# Patient Record
Sex: Female | Born: 1940 | Race: White | Hispanic: No | Smoking: Former smoker
Health system: Southern US, Community
[De-identification: ages and names within clinical notes are randomized; demographics above are authoritative.]

## PROBLEM LIST (undated history)

## (undated) ENCOUNTER — Emergency Department (HOSPITAL_COMMUNITY): Discharge status: Against Medical Advice | Payer: Medicare Other

## (undated) DIAGNOSIS — M722 Plantar fascial fibromatosis: Secondary | ICD-10-CM

## (undated) DIAGNOSIS — E039 Hypothyroidism, unspecified: Secondary | ICD-10-CM

## (undated) DIAGNOSIS — I10 Essential (primary) hypertension: Secondary | ICD-10-CM

## (undated) DIAGNOSIS — E669 Obesity, unspecified: Secondary | ICD-10-CM

## (undated) DIAGNOSIS — C55 Malignant neoplasm of uterus, part unspecified: Secondary | ICD-10-CM

## (undated) DIAGNOSIS — R000 Tachycardia, unspecified: Secondary | ICD-10-CM

## (undated) DIAGNOSIS — I313 Pericardial effusion (noninflammatory): Secondary | ICD-10-CM

## (undated) HISTORY — PX: CATARACT EXTRACTION: SUR2

## (undated) HISTORY — DX: Plantar fascial fibromatosis: M72.2

## (undated) HISTORY — DX: Obesity, unspecified: E66.9

## (undated) HISTORY — PX: GANGLION CYST EXCISION: SHX1691

## (undated) HISTORY — PX: UMBILICAL HERNIA REPAIR: SHX196

## (undated) HISTORY — DX: Hypothyroidism, unspecified: E03.9

## (undated) HISTORY — PX: TONSILLECTOMY: SUR1361

## (undated) HISTORY — DX: Malignant neoplasm of uterus, part unspecified: C55

## (undated) HISTORY — PX: ABDOMINAL HYSTERECTOMY: SHX81

---

## 1997-11-04 ENCOUNTER — Other Ambulatory Visit: Admission: RE | Admit: 1997-11-04 | Discharge: 1997-11-04 | Payer: Self-pay | Admitting: Obstetrics & Gynecology

## 1998-04-22 ENCOUNTER — Other Ambulatory Visit: Admission: RE | Admit: 1998-04-22 | Discharge: 1998-04-22 | Payer: Self-pay | Admitting: Obstetrics & Gynecology

## 1998-12-10 ENCOUNTER — Other Ambulatory Visit: Admission: RE | Admit: 1998-12-10 | Discharge: 1998-12-10 | Payer: Self-pay | Admitting: Obstetrics & Gynecology

## 2000-01-19 ENCOUNTER — Other Ambulatory Visit: Admission: RE | Admit: 2000-01-19 | Discharge: 2000-01-19 | Payer: Self-pay | Admitting: Obstetrics & Gynecology

## 2001-02-06 ENCOUNTER — Other Ambulatory Visit: Admission: RE | Admit: 2001-02-06 | Discharge: 2001-02-06 | Payer: Self-pay | Admitting: Obstetrics & Gynecology

## 2002-03-12 ENCOUNTER — Other Ambulatory Visit: Admission: RE | Admit: 2002-03-12 | Discharge: 2002-03-12 | Payer: Self-pay | Admitting: Obstetrics & Gynecology

## 2003-04-08 ENCOUNTER — Other Ambulatory Visit: Admission: RE | Admit: 2003-04-08 | Discharge: 2003-04-08 | Payer: Self-pay | Admitting: Obstetrics & Gynecology

## 2003-12-16 ENCOUNTER — Ambulatory Visit (HOSPITAL_COMMUNITY): Admission: RE | Admit: 2003-12-16 | Discharge: 2003-12-16 | Payer: Self-pay | Admitting: General Surgery

## 2003-12-16 ENCOUNTER — Encounter (INDEPENDENT_AMBULATORY_CARE_PROVIDER_SITE_OTHER): Payer: Self-pay | Admitting: *Deleted

## 2004-05-11 ENCOUNTER — Other Ambulatory Visit: Admission: RE | Admit: 2004-05-11 | Discharge: 2004-05-11 | Payer: Self-pay | Admitting: Obstetrics & Gynecology

## 2004-05-19 ENCOUNTER — Ambulatory Visit: Payer: Self-pay | Admitting: Internal Medicine

## 2004-05-26 ENCOUNTER — Ambulatory Visit: Payer: Self-pay | Admitting: Internal Medicine

## 2005-05-30 ENCOUNTER — Other Ambulatory Visit: Admission: RE | Admit: 2005-05-30 | Discharge: 2005-05-30 | Payer: Self-pay | Admitting: Obstetrics & Gynecology

## 2008-04-03 ENCOUNTER — Ambulatory Visit: Admission: RE | Admit: 2008-04-03 | Discharge: 2008-04-03 | Payer: Self-pay | Admitting: Gynecology

## 2008-04-07 ENCOUNTER — Encounter (INDEPENDENT_AMBULATORY_CARE_PROVIDER_SITE_OTHER): Payer: Self-pay | Admitting: Obstetrics & Gynecology

## 2008-04-07 ENCOUNTER — Inpatient Hospital Stay (HOSPITAL_COMMUNITY): Admission: RE | Admit: 2008-04-07 | Discharge: 2008-04-10 | Payer: Self-pay | Admitting: Obstetrics & Gynecology

## 2008-04-13 ENCOUNTER — Ambulatory Visit: Payer: Self-pay | Admitting: Oncology

## 2008-04-28 ENCOUNTER — Ambulatory Visit: Admission: RE | Admit: 2008-04-28 | Discharge: 2008-04-28 | Payer: Self-pay | Admitting: Gynecology

## 2008-05-12 LAB — CBC WITH DIFFERENTIAL/PLATELET
BASO%: 0.4 % (ref 0.0–2.0)
Basophils Absolute: 0 10*3/uL (ref 0.0–0.1)
EOS%: 4.2 % (ref 0.0–7.0)
Eosinophils Absolute: 0.3 10*3/uL (ref 0.0–0.5)
HCT: 43.1 % (ref 34.8–46.6)
HGB: 14.4 g/dL (ref 11.6–15.9)
LYMPH%: 19.6 % (ref 14.0–49.7)
MCH: 30.2 pg (ref 25.1–34.0)
MCHC: 33.3 g/dL (ref 31.5–36.0)
MCV: 90.6 fL (ref 79.5–101.0)
MONO#: 0.4 10*3/uL (ref 0.1–0.9)
MONO%: 6.5 % (ref 0.0–14.0)
NEUT#: 4.7 10*3/uL (ref 1.5–6.5)
NEUT%: 69.3 % (ref 38.4–76.8)
Platelets: 234 10*3/uL (ref 145–400)
RBC: 4.76 10*6/uL (ref 3.70–5.45)
RDW: 13.1 % (ref 11.2–14.5)
WBC: 6.8 10*3/uL (ref 3.9–10.3)
lymph#: 1.3 10*3/uL (ref 0.9–3.3)

## 2008-05-12 LAB — COMPREHENSIVE METABOLIC PANEL
ALT: 17 U/L (ref 0–35)
AST: 15 U/L (ref 0–37)
Albumin: 4.4 g/dL (ref 3.5–5.2)
Alkaline Phosphatase: 75 U/L (ref 39–117)
BUN: 13 mg/dL (ref 6–23)
CO2: 29 mEq/L (ref 19–32)
Calcium: 10 mg/dL (ref 8.4–10.5)
Chloride: 101 mEq/L (ref 96–112)
Creatinine, Ser: 0.81 mg/dL (ref 0.40–1.20)
Glucose, Bld: 166 mg/dL — ABNORMAL HIGH (ref 70–99)
Potassium: 4 mEq/L (ref 3.5–5.3)
Sodium: 141 mEq/L (ref 135–145)
Total Bilirubin: 0.9 mg/dL (ref 0.3–1.2)
Total Protein: 6.9 g/dL (ref 6.0–8.3)

## 2008-05-15 LAB — WHOLE BLOOD GLUCOSE
Glucose: 239 mg/dL — ABNORMAL HIGH (ref 70–100)
HRS PC: 1 Hours

## 2008-05-21 LAB — CBC WITH DIFFERENTIAL/PLATELET
BASO%: 0.2 % (ref 0.0–2.0)
Basophils Absolute: 0 10*3/uL (ref 0.0–0.1)
EOS%: 3.7 % (ref 0.0–7.0)
Eosinophils Absolute: 0.2 10*3/uL (ref 0.0–0.5)
HCT: 40.2 % (ref 34.8–46.6)
HGB: 13.6 g/dL (ref 11.6–15.9)
LYMPH%: 23.6 % (ref 14.0–49.7)
MCH: 30.6 pg (ref 25.1–34.0)
MCHC: 33.9 g/dL (ref 31.5–36.0)
MCV: 90.4 fL (ref 79.5–101.0)
MONO#: 0.2 10*3/uL (ref 0.1–0.9)
MONO%: 2.9 % (ref 0.0–14.0)
NEUT#: 4 10*3/uL (ref 1.5–6.5)
NEUT%: 69.6 % (ref 38.4–76.8)
Platelets: 210 10*3/uL (ref 145–400)
RBC: 4.45 10*6/uL (ref 3.70–5.45)
RDW: 13 % (ref 11.2–14.5)
WBC: 5.8 10*3/uL (ref 3.9–10.3)
lymph#: 1.4 10*3/uL (ref 0.9–3.3)

## 2008-05-27 LAB — CBC WITH DIFFERENTIAL/PLATELET
BASO%: 0.6 % (ref 0.0–2.0)
Basophils Absolute: 0 10*3/uL (ref 0.0–0.1)
EOS%: 3.4 % (ref 0.0–7.0)
Eosinophils Absolute: 0.1 10*3/uL (ref 0.0–0.5)
HCT: 37.9 % (ref 34.8–46.6)
HGB: 13.1 g/dL (ref 11.6–15.9)
LYMPH%: 39.6 % (ref 14.0–49.7)
MCH: 31.2 pg (ref 25.1–34.0)
MCHC: 34.4 g/dL (ref 31.5–36.0)
MCV: 90.7 fL (ref 79.5–101.0)
MONO#: 0.4 10*3/uL (ref 0.1–0.9)
MONO%: 12.6 % (ref 0.0–14.0)
NEUT#: 1.3 10*3/uL — ABNORMAL LOW (ref 1.5–6.5)
NEUT%: 43.8 % (ref 38.4–76.8)
Platelets: 207 10*3/uL (ref 145–400)
RBC: 4.18 10*6/uL (ref 3.70–5.45)
RDW: 13 % (ref 11.2–14.5)
WBC: 2.9 10*3/uL — ABNORMAL LOW (ref 3.9–10.3)
lymph#: 1.2 10*3/uL (ref 0.9–3.3)

## 2008-05-27 LAB — COMPREHENSIVE METABOLIC PANEL
ALT: 22 U/L (ref 0–35)
AST: 17 U/L (ref 0–37)
Albumin: 4.2 g/dL (ref 3.5–5.2)
Alkaline Phosphatase: 67 U/L (ref 39–117)
BUN: 17 mg/dL (ref 6–23)
CO2: 26 mEq/L (ref 19–32)
Calcium: 9.4 mg/dL (ref 8.4–10.5)
Chloride: 106 mEq/L (ref 96–112)
Creatinine, Ser: 0.81 mg/dL (ref 0.40–1.20)
Glucose, Bld: 153 mg/dL — ABNORMAL HIGH (ref 70–99)
Potassium: 4 mEq/L (ref 3.5–5.3)
Sodium: 143 mEq/L (ref 135–145)
Total Bilirubin: 0.7 mg/dL (ref 0.3–1.2)
Total Protein: 6.6 g/dL (ref 6.0–8.3)

## 2008-06-01 ENCOUNTER — Ambulatory Visit: Payer: Self-pay | Admitting: Oncology

## 2008-06-03 LAB — CBC WITH DIFFERENTIAL/PLATELET
BASO%: 0.4 % (ref 0.0–2.0)
Basophils Absolute: 0 10*3/uL (ref 0.0–0.1)
EOS%: 1.9 % (ref 0.0–7.0)
Eosinophils Absolute: 0.1 10*3/uL (ref 0.0–0.5)
HCT: 40.6 % (ref 34.8–46.6)
HGB: 13.6 g/dL (ref 11.6–15.9)
LYMPH%: 26.2 % (ref 14.0–49.7)
MCH: 30.4 pg (ref 25.1–34.0)
MCHC: 33.5 g/dL (ref 31.5–36.0)
MCV: 90.8 fL (ref 79.5–101.0)
MONO#: 0.5 10*3/uL (ref 0.1–0.9)
MONO%: 10.2 % (ref 0.0–14.0)
NEUT#: 3.3 10*3/uL (ref 1.5–6.5)
NEUT%: 61.3 % (ref 38.4–76.8)
Platelets: 173 10*3/uL (ref 145–400)
RBC: 4.47 10*6/uL (ref 3.70–5.45)
RDW: 13.6 % (ref 11.2–14.5)
WBC: 5.3 10*3/uL (ref 3.9–10.3)
lymph#: 1.4 10*3/uL (ref 0.9–3.3)

## 2008-06-04 LAB — WHOLE BLOOD GLUCOSE
Glucose: 294 mg/dL — ABNORMAL HIGH (ref 70–100)
HRS PC: 0.5 Hours

## 2008-06-17 LAB — CBC WITH DIFFERENTIAL/PLATELET
BASO%: 0.5 % (ref 0.0–2.0)
Basophils Absolute: 0 10*3/uL (ref 0.0–0.1)
EOS%: 2.9 % (ref 0.0–7.0)
Eosinophils Absolute: 0.1 10*3/uL (ref 0.0–0.5)
HCT: 37.6 % (ref 34.8–46.6)
HGB: 13.1 g/dL (ref 11.6–15.9)
LYMPH%: 48.4 % (ref 14.0–49.7)
MCH: 31.8 pg (ref 25.1–34.0)
MCHC: 34.8 g/dL (ref 31.5–36.0)
MCV: 91.4 fL (ref 79.5–101.0)
MONO#: 0.4 10*3/uL (ref 0.1–0.9)
MONO%: 15.4 % — ABNORMAL HIGH (ref 0.0–14.0)
NEUT#: 0.9 10*3/uL — ABNORMAL LOW (ref 1.5–6.5)
NEUT%: 32.8 % — ABNORMAL LOW (ref 38.4–76.8)
Platelets: 214 10*3/uL (ref 145–400)
RBC: 4.11 10*6/uL (ref 3.70–5.45)
RDW: 14.3 % (ref 11.2–14.5)
WBC: 2.7 10*3/uL — ABNORMAL LOW (ref 3.9–10.3)
lymph#: 1.3 10*3/uL (ref 0.9–3.3)

## 2008-06-17 LAB — COMPREHENSIVE METABOLIC PANEL
ALT: 19 U/L (ref 0–35)
AST: 17 U/L (ref 0–37)
Albumin: 4.3 g/dL (ref 3.5–5.2)
Alkaline Phosphatase: 78 U/L (ref 39–117)
BUN: 18 mg/dL (ref 6–23)
CO2: 27 mEq/L (ref 19–32)
Calcium: 9.4 mg/dL (ref 8.4–10.5)
Chloride: 105 mEq/L (ref 96–112)
Creatinine, Ser: 0.8 mg/dL (ref 0.40–1.20)
Glucose, Bld: 108 mg/dL — ABNORMAL HIGH (ref 70–99)
Potassium: 4.3 mEq/L (ref 3.5–5.3)
Sodium: 141 mEq/L (ref 135–145)
Total Bilirubin: 0.8 mg/dL (ref 0.3–1.2)
Total Protein: 6.5 g/dL (ref 6.0–8.3)

## 2008-06-24 LAB — CBC WITH DIFFERENTIAL/PLATELET
BASO%: 0.5 % (ref 0.0–2.0)
Basophils Absolute: 0 10*3/uL (ref 0.0–0.1)
EOS%: 0.8 % (ref 0.0–7.0)
Eosinophils Absolute: 0.1 10*3/uL (ref 0.0–0.5)
HCT: 39.3 % (ref 34.8–46.6)
HGB: 13.6 g/dL (ref 11.6–15.9)
LYMPH%: 33 % (ref 14.0–49.7)
MCH: 31.3 pg (ref 25.1–34.0)
MCHC: 34.6 g/dL (ref 31.5–36.0)
MCV: 90.3 fL (ref 79.5–101.0)
MONO#: 0.6 10*3/uL (ref 0.1–0.9)
MONO%: 9 % (ref 0.0–14.0)
NEUT#: 3.5 10*3/uL (ref 1.5–6.5)
NEUT%: 56.7 % (ref 38.4–76.8)
Platelets: 206 10*3/uL (ref 145–400)
RBC: 4.35 10*6/uL (ref 3.70–5.45)
RDW: 14.9 % — ABNORMAL HIGH (ref 11.2–14.5)
WBC: 6.2 10*3/uL (ref 3.9–10.3)
lymph#: 2 10*3/uL (ref 0.9–3.3)

## 2008-06-25 LAB — WHOLE BLOOD GLUCOSE
Glucose: 265 mg/dL — ABNORMAL HIGH (ref 70–100)
HRS PC: 1 Hours

## 2008-07-01 LAB — CBC WITH DIFFERENTIAL/PLATELET
BASO%: 0.2 % (ref 0.0–2.0)
Basophils Absolute: 0 10*3/uL (ref 0.0–0.1)
EOS%: 1.9 % (ref 0.0–7.0)
Eosinophils Absolute: 0.1 10*3/uL (ref 0.0–0.5)
HCT: 38.6 % (ref 34.8–46.6)
HGB: 13 g/dL (ref 11.6–15.9)
LYMPH%: 31.4 % (ref 14.0–49.7)
MCH: 31.2 pg (ref 25.1–34.0)
MCHC: 33.7 g/dL (ref 31.5–36.0)
MCV: 92.6 fL (ref 79.5–101.0)
MONO#: 0.1 10*3/uL (ref 0.1–0.9)
MONO%: 2.4 % (ref 0.0–14.0)
NEUT#: 3.7 10*3/uL (ref 1.5–6.5)
NEUT%: 64.1 % (ref 38.4–76.8)
Platelets: 167 10*3/uL (ref 145–400)
RBC: 4.17 10*6/uL (ref 3.70–5.45)
RDW: 14.8 % — ABNORMAL HIGH (ref 11.2–14.5)
WBC: 5.8 10*3/uL (ref 3.9–10.3)
lymph#: 1.8 10*3/uL (ref 0.9–3.3)

## 2008-07-07 LAB — CBC WITH DIFFERENTIAL/PLATELET
BASO%: 0.6 % (ref 0.0–2.0)
Basophils Absolute: 0 10*3/uL (ref 0.0–0.1)
EOS%: 1.6 % (ref 0.0–7.0)
Eosinophils Absolute: 0.1 10*3/uL (ref 0.0–0.5)
HCT: 37.1 % (ref 34.8–46.6)
HGB: 12.6 g/dL (ref 11.6–15.9)
LYMPH%: 44.8 % (ref 14.0–49.7)
MCH: 31 pg (ref 25.1–34.0)
MCHC: 34 g/dL (ref 31.5–36.0)
MCV: 91.2 fL (ref 79.5–101.0)
MONO#: 0.5 10*3/uL (ref 0.1–0.9)
MONO%: 15.1 % — ABNORMAL HIGH (ref 0.0–14.0)
NEUT#: 1.2 10*3/uL — ABNORMAL LOW (ref 1.5–6.5)
NEUT%: 37.9 % — ABNORMAL LOW (ref 38.4–76.8)
Platelets: 187 10*3/uL (ref 145–400)
RBC: 4.07 10*6/uL (ref 3.70–5.45)
RDW: 14.7 % — ABNORMAL HIGH (ref 11.2–14.5)
WBC: 3.2 10*3/uL — ABNORMAL LOW (ref 3.9–10.3)
lymph#: 1.4 10*3/uL (ref 0.9–3.3)
nRBC: 0 % (ref 0–0)

## 2008-07-13 LAB — CBC WITH DIFFERENTIAL/PLATELET
BASO%: 0.2 % (ref 0.0–2.0)
Basophils Absolute: 0 10*3/uL (ref 0.0–0.1)
EOS%: 0.2 % (ref 0.0–7.0)
Eosinophils Absolute: 0 10*3/uL (ref 0.0–0.5)
HCT: 36.4 % (ref 34.8–46.6)
HGB: 12.6 g/dL (ref 11.6–15.9)
LYMPH%: 12.8 % — ABNORMAL LOW (ref 14.0–49.7)
MCH: 31.7 pg (ref 25.1–34.0)
MCHC: 34.6 g/dL (ref 31.5–36.0)
MCV: 91.5 fL (ref 79.5–101.0)
MONO#: 1.3 10*3/uL — ABNORMAL HIGH (ref 0.1–0.9)
MONO%: 12.6 % (ref 0.0–14.0)
NEUT#: 7.8 10*3/uL — ABNORMAL HIGH (ref 1.5–6.5)
NEUT%: 74.2 % (ref 38.4–76.8)
Platelets: 185 10*3/uL (ref 145–400)
RBC: 3.98 10*6/uL (ref 3.70–5.45)
RDW: 15.1 % — ABNORMAL HIGH (ref 11.2–14.5)
WBC: 10.6 10*3/uL — ABNORMAL HIGH (ref 3.9–10.3)
lymph#: 1.4 10*3/uL (ref 0.9–3.3)
nRBC: 0 % (ref 0–0)

## 2008-07-14 ENCOUNTER — Ambulatory Visit (HOSPITAL_COMMUNITY): Admission: RE | Admit: 2008-07-14 | Discharge: 2008-07-14 | Payer: Self-pay | Admitting: Oncology

## 2008-07-14 LAB — URINALYSIS, MICROSCOPIC - CHCC
Bilirubin (Urine): NEGATIVE
Blood: NEGATIVE
Glucose: NEGATIVE g/dL
Ketones: NEGATIVE mg/dL
Leukocyte Esterase: NEGATIVE
Nitrite: NEGATIVE
Protein: 30 mg/dL
Specific Gravity, Urine: 1.025 (ref 1.003–1.035)
pH: 5 (ref 4.6–8.0)

## 2008-07-19 LAB — CULTURE, BLOOD (SINGLE)

## 2008-07-20 LAB — CULTURE, BLOOD (SINGLE)

## 2008-07-21 ENCOUNTER — Ambulatory Visit: Payer: Self-pay | Admitting: Oncology

## 2008-07-22 LAB — CBC WITH DIFFERENTIAL/PLATELET
BASO%: 0.2 % (ref 0.0–2.0)
Basophils Absolute: 0 10*3/uL (ref 0.0–0.1)
EOS%: 1 % (ref 0.0–7.0)
Eosinophils Absolute: 0.1 10*3/uL (ref 0.0–0.5)
HCT: 32 % — ABNORMAL LOW (ref 34.8–46.6)
HGB: 10.8 g/dL — ABNORMAL LOW (ref 11.6–15.9)
LYMPH%: 20 % (ref 14.0–49.7)
MCH: 31.7 pg (ref 25.1–34.0)
MCHC: 33.7 g/dL (ref 31.5–36.0)
MCV: 94.2 fL (ref 79.5–101.0)
MONO#: 0.6 10*3/uL (ref 0.1–0.9)
MONO%: 8.4 % (ref 0.0–14.0)
NEUT#: 5.1 10*3/uL (ref 1.5–6.5)
NEUT%: 70.4 % (ref 38.4–76.8)
Platelets: 344 10*3/uL (ref 145–400)
RBC: 3.4 10*6/uL — ABNORMAL LOW (ref 3.70–5.45)
RDW: 15.7 % — ABNORMAL HIGH (ref 11.2–14.5)
WBC: 7.2 10*3/uL (ref 3.9–10.3)
lymph#: 1.4 10*3/uL (ref 0.9–3.3)

## 2008-07-22 LAB — COMPREHENSIVE METABOLIC PANEL
ALT: 14 U/L (ref 0–35)
AST: 14 U/L (ref 0–37)
Albumin: 3.5 g/dL (ref 3.5–5.2)
Alkaline Phosphatase: 154 U/L — ABNORMAL HIGH (ref 39–117)
BUN: 17 mg/dL (ref 6–23)
CO2: 26 mEq/L (ref 19–32)
Calcium: 9.2 mg/dL (ref 8.4–10.5)
Chloride: 101 mEq/L (ref 96–112)
Creatinine, Ser: 0.86 mg/dL (ref 0.40–1.20)
Glucose, Bld: 123 mg/dL — ABNORMAL HIGH (ref 70–99)
Potassium: 3.9 mEq/L (ref 3.5–5.3)
Sodium: 141 mEq/L (ref 135–145)
Total Bilirubin: 0.6 mg/dL (ref 0.3–1.2)
Total Protein: 6.8 g/dL (ref 6.0–8.3)

## 2008-07-23 LAB — WHOLE BLOOD GLUCOSE
Glucose: 307 mg/dL — ABNORMAL HIGH (ref 70–100)
Glucose: 239 mg/dL — ABNORMAL HIGH (ref 70–100)
HRS PC: 1.5 Hours
HRS PC: 0.5 Hours

## 2008-08-11 LAB — COMPREHENSIVE METABOLIC PANEL
ALT: 11 U/L (ref 0–35)
AST: 10 U/L (ref 0–37)
Albumin: 3.5 g/dL (ref 3.5–5.2)
Alkaline Phosphatase: 126 U/L — ABNORMAL HIGH (ref 39–117)
BUN: 15 mg/dL (ref 6–23)
CO2: 25 mEq/L (ref 19–32)
Calcium: 9.6 mg/dL (ref 8.4–10.5)
Chloride: 99 mEq/L (ref 96–112)
Creatinine, Ser: 0.94 mg/dL (ref 0.40–1.20)
Glucose, Bld: 231 mg/dL — ABNORMAL HIGH (ref 70–99)
Potassium: 4.1 mEq/L (ref 3.5–5.3)
Sodium: 138 mEq/L (ref 135–145)
Total Bilirubin: 0.7 mg/dL (ref 0.3–1.2)
Total Protein: 6.9 g/dL (ref 6.0–8.3)

## 2008-08-11 LAB — CBC WITH DIFFERENTIAL/PLATELET
BASO%: 0.4 % (ref 0.0–2.0)
Basophils Absolute: 0 10*3/uL (ref 0.0–0.1)
EOS%: 0.8 % (ref 0.0–7.0)
Eosinophils Absolute: 0.1 10*3/uL (ref 0.0–0.5)
HCT: 25.6 % — ABNORMAL LOW (ref 34.8–46.6)
HGB: 9 g/dL — ABNORMAL LOW (ref 11.6–15.9)
LYMPH%: 16.5 % (ref 14.0–49.7)
MCH: 32.8 pg (ref 25.1–34.0)
MCHC: 35 g/dL (ref 31.5–36.0)
MCV: 93.8 fL (ref 79.5–101.0)
MONO#: 0.5 10*3/uL (ref 0.1–0.9)
MONO%: 7.3 % (ref 0.0–14.0)
NEUT#: 5.2 10*3/uL (ref 1.5–6.5)
NEUT%: 75 % (ref 38.4–76.8)
Platelets: 243 10*3/uL (ref 145–400)
RBC: 2.73 10*6/uL — ABNORMAL LOW (ref 3.70–5.45)
RDW: 14.9 % — ABNORMAL HIGH (ref 11.2–14.5)
WBC: 6.9 10*3/uL (ref 3.9–10.3)
lymph#: 1.1 10*3/uL (ref 0.9–3.3)

## 2008-08-13 LAB — WHOLE BLOOD GLUCOSE
Glucose: 364 mg/dL — ABNORMAL HIGH (ref 70–100)
Glucose: 315 mg/dL — ABNORMAL HIGH (ref 70–100)
HRS PC: 2 Hours
HRS PC: 3.5 Hours

## 2008-08-19 LAB — CBC WITH DIFFERENTIAL/PLATELET
BASO%: 0.4 % (ref 0.0–2.0)
Basophils Absolute: 0 10*3/uL (ref 0.0–0.1)
EOS%: 1.1 % (ref 0.0–7.0)
Eosinophils Absolute: 0.1 10*3/uL (ref 0.0–0.5)
HCT: 28.5 % — ABNORMAL LOW (ref 34.8–46.6)
HGB: 9.4 g/dL — ABNORMAL LOW (ref 11.6–15.9)
LYMPH%: 31.4 % (ref 14.0–49.7)
MCH: 30.8 pg (ref 25.1–34.0)
MCHC: 33 g/dL (ref 31.5–36.0)
MCV: 93.4 fL (ref 79.5–101.0)
MONO#: 0.3 10*3/uL (ref 0.1–0.9)
MONO%: 4.8 % (ref 0.0–14.0)
NEUT#: 3.5 10*3/uL (ref 1.5–6.5)
NEUT%: 62.3 % (ref 38.4–76.8)
Platelets: 280 10*3/uL (ref 145–400)
RBC: 3.05 10*6/uL — ABNORMAL LOW (ref 3.70–5.45)
RDW: 14.4 % (ref 11.2–14.5)
WBC: 5.6 10*3/uL (ref 3.9–10.3)
lymph#: 1.8 10*3/uL (ref 0.9–3.3)

## 2008-08-27 ENCOUNTER — Ambulatory Visit: Payer: Self-pay | Admitting: Oncology

## 2008-08-31 LAB — CBC WITH DIFFERENTIAL/PLATELET
BASO%: 0.4 % (ref 0.0–2.0)
Basophils Absolute: 0 10*3/uL (ref 0.0–0.1)
EOS%: 0.4 % (ref 0.0–7.0)
Eosinophils Absolute: 0 10*3/uL (ref 0.0–0.5)
HCT: 27.1 % — ABNORMAL LOW (ref 34.8–46.6)
HGB: 9.4 g/dL — ABNORMAL LOW (ref 11.6–15.9)
LYMPH%: 27.4 % (ref 14.0–49.7)
MCH: 33.7 pg (ref 25.1–34.0)
MCHC: 34.8 g/dL (ref 31.5–36.0)
MCV: 96.8 fL (ref 79.5–101.0)
MONO#: 0.4 10*3/uL (ref 0.1–0.9)
MONO%: 8.8 % (ref 0.0–14.0)
NEUT#: 2.6 10*3/uL (ref 1.5–6.5)
NEUT%: 63 % (ref 38.4–76.8)
Platelets: 197 10*3/uL (ref 145–400)
RBC: 2.8 10*6/uL — ABNORMAL LOW (ref 3.70–5.45)
RDW: 17.8 % — ABNORMAL HIGH (ref 11.2–14.5)
WBC: 4.2 10*3/uL (ref 3.9–10.3)
lymph#: 1.1 10*3/uL (ref 0.9–3.3)

## 2008-09-01 LAB — COMPREHENSIVE METABOLIC PANEL
ALT: 10 U/L (ref 0–35)
AST: 13 U/L (ref 0–37)
Albumin: 3.8 g/dL (ref 3.5–5.2)
Alkaline Phosphatase: 81 U/L (ref 39–117)
BUN: 16 mg/dL (ref 6–23)
CO2: 25 mEq/L (ref 19–32)
Calcium: 9.3 mg/dL (ref 8.4–10.5)
Chloride: 104 mEq/L (ref 96–112)
Creatinine, Ser: 0.81 mg/dL (ref 0.40–1.20)
Glucose, Bld: 117 mg/dL — ABNORMAL HIGH (ref 70–99)
Potassium: 4.2 mEq/L (ref 3.5–5.3)
Sodium: 141 mEq/L (ref 135–145)
Total Bilirubin: 0.5 mg/dL (ref 0.3–1.2)
Total Protein: 6.4 g/dL (ref 6.0–8.3)

## 2008-09-03 LAB — WHOLE BLOOD GLUCOSE
Glucose: 108 mg/dL — ABNORMAL HIGH (ref 70–100)
Glucose: 134 mg/dL — ABNORMAL HIGH (ref 70–100)
HRS PC: 7.5 Hours
HRS PC: 0.75 Hours

## 2008-09-29 ENCOUNTER — Ambulatory Visit: Payer: Self-pay | Admitting: Oncology

## 2008-09-29 LAB — CBC WITH DIFFERENTIAL/PLATELET
BASO%: 0.3 % (ref 0.0–2.0)
Basophils Absolute: 0 10*3/uL (ref 0.0–0.1)
EOS%: 1.1 % (ref 0.0–7.0)
Eosinophils Absolute: 0 10*3/uL (ref 0.0–0.5)
HCT: 31.7 % — ABNORMAL LOW (ref 34.8–46.6)
HGB: 10.9 g/dL — ABNORMAL LOW (ref 11.6–15.9)
LYMPH%: 43.5 % (ref 14.0–49.7)
MCH: 34.9 pg — ABNORMAL HIGH (ref 25.1–34.0)
MCHC: 34.4 g/dL (ref 31.5–36.0)
MCV: 101.3 fL — ABNORMAL HIGH (ref 79.5–101.0)
MONO#: 0.3 10*3/uL (ref 0.1–0.9)
MONO%: 11 % (ref 0.0–14.0)
NEUT#: 1.3 10*3/uL — ABNORMAL LOW (ref 1.5–6.5)
NEUT%: 44.1 % (ref 38.4–76.8)
Platelets: 160 10*3/uL (ref 145–400)
RBC: 3.13 10*6/uL — ABNORMAL LOW (ref 3.70–5.45)
RDW: 19.6 % — ABNORMAL HIGH (ref 11.2–14.5)
WBC: 3 10*3/uL — ABNORMAL LOW (ref 3.9–10.3)
lymph#: 1.3 10*3/uL (ref 0.9–3.3)

## 2008-09-29 LAB — COMPREHENSIVE METABOLIC PANEL
ALT: 14 U/L (ref 0–35)
AST: 15 U/L (ref 0–37)
Albumin: 4.3 g/dL (ref 3.5–5.2)
Alkaline Phosphatase: 78 U/L (ref 39–117)
BUN: 17 mg/dL (ref 6–23)
CO2: 27 mEq/L (ref 19–32)
Calcium: 10.2 mg/dL (ref 8.4–10.5)
Chloride: 103 mEq/L (ref 96–112)
Creatinine, Ser: 0.77 mg/dL (ref 0.40–1.20)
Glucose, Bld: 120 mg/dL — ABNORMAL HIGH (ref 70–99)
Potassium: 4.2 mEq/L (ref 3.5–5.3)
Sodium: 141 mEq/L (ref 135–145)
Total Bilirubin: 0.8 mg/dL (ref 0.3–1.2)
Total Protein: 6.9 g/dL (ref 6.0–8.3)

## 2008-10-01 ENCOUNTER — Encounter (INDEPENDENT_AMBULATORY_CARE_PROVIDER_SITE_OTHER): Payer: Self-pay | Admitting: *Deleted

## 2008-10-01 ENCOUNTER — Ambulatory Visit (HOSPITAL_COMMUNITY): Admission: RE | Admit: 2008-10-01 | Discharge: 2008-10-01 | Payer: Self-pay | Admitting: Oncology

## 2008-10-07 ENCOUNTER — Ambulatory Visit: Admission: RE | Admit: 2008-10-07 | Discharge: 2008-10-07 | Payer: Self-pay | Admitting: Gynecology

## 2008-10-07 LAB — CBC WITH DIFFERENTIAL/PLATELET
BASO%: 0.4 % (ref 0.0–2.0)
Basophils Absolute: 0 10*3/uL (ref 0.0–0.1)
EOS%: 0.7 % (ref 0.0–7.0)
Eosinophils Absolute: 0 10*3/uL (ref 0.0–0.5)
HCT: 32.9 % — ABNORMAL LOW (ref 34.8–46.6)
HGB: 11.4 g/dL — ABNORMAL LOW (ref 11.6–15.9)
LYMPH%: 31.8 % (ref 14.0–49.7)
MCH: 35.6 pg — ABNORMAL HIGH (ref 25.1–34.0)
MCHC: 34.7 g/dL (ref 31.5–36.0)
MCV: 102.7 fL — ABNORMAL HIGH (ref 79.5–101.0)
MONO#: 0.3 10*3/uL (ref 0.1–0.9)
MONO%: 8.7 % (ref 0.0–14.0)
NEUT#: 1.7 10*3/uL (ref 1.5–6.5)
NEUT%: 58.4 % (ref 38.4–76.8)
Platelets: 264 10*3/uL (ref 145–400)
RBC: 3.2 10*6/uL — ABNORMAL LOW (ref 3.70–5.45)
RDW: 18.3 % — ABNORMAL HIGH (ref 11.2–14.5)
WBC: 3 10*3/uL — ABNORMAL LOW (ref 3.9–10.3)
lymph#: 0.9 10*3/uL (ref 0.9–3.3)

## 2008-10-08 LAB — BUN: BUN: 17 mg/dL (ref 6–23)

## 2008-10-08 LAB — APTT: aPTT: 24 seconds (ref 24–37)

## 2008-10-08 LAB — CREATININE, SERUM: Creatinine, Ser: 0.76 mg/dL (ref 0.40–1.20)

## 2008-10-08 LAB — PROTHROMBIN TIME
INR: 1 (ref 0.0–1.5)
Prothrombin Time: 13.1 seconds (ref 11.6–15.2)

## 2008-10-08 LAB — CA 125: CA 125: 5 U/mL (ref 0.0–30.2)

## 2008-10-13 ENCOUNTER — Encounter (INDEPENDENT_AMBULATORY_CARE_PROVIDER_SITE_OTHER): Payer: Self-pay | Admitting: *Deleted

## 2008-10-13 ENCOUNTER — Encounter (INDEPENDENT_AMBULATORY_CARE_PROVIDER_SITE_OTHER): Payer: Self-pay | Admitting: Interventional Radiology

## 2008-10-13 ENCOUNTER — Ambulatory Visit: Admission: RE | Admit: 2008-10-13 | Discharge: 2008-10-13 | Payer: Self-pay | Admitting: Gynecology

## 2008-10-15 ENCOUNTER — Ambulatory Visit: Admission: RE | Admit: 2008-10-15 | Discharge: 2008-11-04 | Payer: Self-pay | Admitting: Radiation Oncology

## 2008-12-31 ENCOUNTER — Ambulatory Visit (HOSPITAL_COMMUNITY): Admission: RE | Admit: 2008-12-31 | Discharge: 2008-12-31 | Payer: Self-pay | Admitting: Gynecology

## 2008-12-31 ENCOUNTER — Encounter (INDEPENDENT_AMBULATORY_CARE_PROVIDER_SITE_OTHER): Payer: Self-pay | Admitting: *Deleted

## 2009-01-29 ENCOUNTER — Other Ambulatory Visit: Admission: RE | Admit: 2009-01-29 | Discharge: 2009-01-29 | Payer: Self-pay | Admitting: Gynecology

## 2009-01-29 ENCOUNTER — Ambulatory Visit: Admission: RE | Admit: 2009-01-29 | Discharge: 2009-01-29 | Payer: Self-pay | Admitting: Gynecology

## 2009-04-23 ENCOUNTER — Encounter (INDEPENDENT_AMBULATORY_CARE_PROVIDER_SITE_OTHER): Payer: Self-pay | Admitting: *Deleted

## 2009-05-05 ENCOUNTER — Ambulatory Visit: Admission: RE | Admit: 2009-05-05 | Discharge: 2009-05-05 | Payer: Self-pay | Admitting: Gynecology

## 2009-05-05 ENCOUNTER — Other Ambulatory Visit: Admission: RE | Admit: 2009-05-05 | Discharge: 2009-05-05 | Payer: Self-pay | Admitting: Gynecology

## 2009-06-07 ENCOUNTER — Encounter (INDEPENDENT_AMBULATORY_CARE_PROVIDER_SITE_OTHER): Payer: Self-pay | Admitting: *Deleted

## 2009-07-13 ENCOUNTER — Ambulatory Visit: Payer: Self-pay | Admitting: Internal Medicine

## 2009-07-13 DIAGNOSIS — K219 Gastro-esophageal reflux disease without esophagitis: Secondary | ICD-10-CM | POA: Insufficient documentation

## 2009-07-13 DIAGNOSIS — R1319 Other dysphagia: Secondary | ICD-10-CM | POA: Insufficient documentation

## 2009-07-13 DIAGNOSIS — E119 Type 2 diabetes mellitus without complications: Secondary | ICD-10-CM | POA: Insufficient documentation

## 2009-08-11 ENCOUNTER — Other Ambulatory Visit: Admission: RE | Admit: 2009-08-11 | Discharge: 2009-08-11 | Payer: Self-pay | Admitting: Gynecology

## 2009-08-11 ENCOUNTER — Ambulatory Visit: Admission: RE | Admit: 2009-08-11 | Discharge: 2009-08-11 | Payer: Self-pay | Admitting: Gynecology

## 2009-08-17 ENCOUNTER — Ambulatory Visit: Payer: Self-pay | Admitting: Internal Medicine

## 2009-11-12 ENCOUNTER — Ambulatory Visit: Admission: RE | Admit: 2009-11-12 | Discharge: 2009-11-12 | Payer: Self-pay | Admitting: Gynecology

## 2009-11-12 ENCOUNTER — Other Ambulatory Visit: Admission: RE | Admit: 2009-11-12 | Discharge: 2009-11-12 | Payer: Self-pay | Admitting: Gynecology

## 2010-01-05 ENCOUNTER — Ambulatory Visit (HOSPITAL_COMMUNITY)
Admission: RE | Admit: 2010-01-05 | Discharge: 2010-01-05 | Payer: Self-pay | Source: Home / Self Care | Admitting: Internal Medicine

## 2010-03-24 NOTE — Op Note (Signed)
Summary: Operative Report   NAME:  Lori Little, Lori Little               ACCOUNT NO.:  [id number]   MEDICAL RECORD NO.:  [id number]          PATIENT TYPE:  AMB   LOCATION:  DAY                          FACILITY:  [facility]   PHYSICIAN:  Angelia Mould. Derrell Lolling, M.D.DATE OF BIRTH:  Jan 10, 1941   DATE OF PROCEDURE:  12/16/2003  DATE OF DISCHARGE:                                 OPERATIVE REPORT   PREOPERATIVE DIAGNOSIS:  Umbilical hernia.   POSTOPERATIVE DIAGNOSIS:  Umbilical hernia.   OPERATION PERFORMED:  Repair of umbilical hernia.   SURGEON:  Angelia Mould. Derrell Lolling, M.D.   OPERATIVE INDICATIONS:  This is a 70 year old white female who has noticed a  bulge in her umbilicus for about six to eight months.  She is really not  having many symptoms but is concerned about the bulge and concerned about  risk of incarceration and strangulation.  On exam, she is somewhat obese and  has a nontender umbilical hernia that is partially reducible, with the  defect about 2 cm in size.  She is brought to the operating room electively.   OPERATIVE TECHNIQUE:  Following the induction of general endotracheal  anesthesia, the patient's abdomen was prepped and draped in a sterile  fashion.  Intravenous Ancef was given preoperatively.  One percent Xylocaine  with epinephrine was used as a local infiltration anesthetic.  A curved  transverse incision was made just below the lower rim of the umbilicus.  Dissection was carried down through the subcutaneous tissue.  The fascia  around the umbilical skin was cleaned off, and the umbilical skin dissected  off away from the umbilical hernia sac and defect.  We cleaned off the  fascia all around the defect.  Some incarcerated omentum was dissected away  from the edge of the fascia and reduced into the preperitoneal and  peritoneal space.  The muscle and fascia around the defect appeared quite  strong and full-thickness, and we chose to repair this primarily.  The  hernia repair was  performed in a vest-over-pants fashion using four  interrupted sutures of #1 Novofil.  The sutures were individually placed,  and then the edges of the fascia lifted up and the sutures held tight, and  the overlap looked good.  We then tied all four sutures.  This provided a  very secure overlapping repair.  The wound was irrigated with saline.  Hemostasis was excellent.  The umbilical skin was tacked back to the fascia  with 3-0 Vicryl suture.  The subcutaneous tissue was closed with interrupted  sutures of 3-0 Vicryl.  The skin was closed with a running subcuticular  suture of 4-0 Monocryl and Dermabond.  The patient tolerated the procedure  well and was taken to the recovery room in stable condition.   ESTIMATED BLOOD LOSS:  About 5 cc.   COMPLICATIONS:  None.   SPONGE, NEEDLE, AND INSTRUMENT COUNTS:  Correct.     Hayw   HMI/MEDQ  D:  12/16/2003  T:  12/16/2003  Job:  [id number]   cc:   Geoffry Paradise, M.D.  [address]  [place]  [place]  [address]  Fax: [phone]

## 2010-03-24 NOTE — Assessment & Plan Note (Signed)
Summary: GERD, dysphagia, colonoscopy/EGD   History of Present Illness Visit Type: consult Primary GI MD: Yancey Flemings MD Primary Provider: Geoffry Paradise, MD Requesting Provider: Geoffry Paradise, MD Chief Complaint: Recall colon, pt also had problems swallowing pills and reflux.  Pt was told by Dr. Jacky Kindle to begin Prilosec after which dysphagia and reflux resolved.  Pt states Dr. Jacky Kindle recommended she mention EGD as she was due for colonoscopy History of Present Illness:   70 year old female with hypertension, diabetes mellitus, osteoarthritis, GERD, and hypothyroidism. She presented today regarding problems with GERD, dysphagia, and the need for repeat screening colonoscopy. First, patient reports a several month history of new onset pyrosis with occasional regurgitation. Also intermittent dysphagia to big pills and solids. She self treated with Prilosec OTC for 2 weeks and reported overwhelming relief. However, symptoms returned 3 days after discontinuing the medication. She is back on omeprazole as prescribed by Dr. Jacky Kindle. Only minimal problems with dysphagia. No additional GERD symptoms. She has had 17 pound weight gain over the past 5 months. No prior endoscopic evaluation from above. Initial index screening colonoscopy in April 2006 was normal. However significant limitations to 2 the preparation. Follow up at this time recommended. She did receive a recall letter and is interested. Her other multiple problems are stable. She takes Fortamet for diabetes. She takes Ducolax for constipation.   GI Review of Systems    Reports acid reflux, dysphagia with solids, and  weight gain.      Denies abdominal pain, belching, bloating, chest pain, dysphagia with liquids, heartburn, loss of appetite, nausea, vomiting, vomiting blood, and  weight loss.      Reports constipation.     Denies anal fissure, black tarry stools, change in bowel habit, diarrhea, diverticulosis, fecal incontinence, heme  positive stool, hemorrhoids, irritable bowel syndrome, jaundice, light color stool, liver problems, rectal bleeding, and  rectal pain. Preventive Screening-Counseling & Management      Drug Use:  no.      Current Medications (verified): 1)  Prilosec 20 Mg Cpdr (Omeprazole) .Marland Kitchen.. 1 By Mouth As Needed 2)  Benicar Hct 40-12.5 Mg Tabs (Olmesartan Medoxomil-Hctz) .Marland Kitchen.. 1 By Mouth Once Daily 3)  Fortamet 500 Mg Xr24h-Tab (Metformin Hcl) .Marland Kitchen.. 1 By Mouth Two Times A Day 4)  Aspirin 81 Mg Tbec (Aspirin) .Marland Kitchen.. 1 By Mouth Once Daily 5)  Synthroid 112 Mcg Tabs (Levothyroxine Sodium) .... Take 1 Tablet By Mouth Once A Day 6)  Calcium 600 1500 Mg Tabs (Calcium Carbonate) .... Take 1 Tablet By Mouth Once A Day 7)  Multivitamins  Tabs (Multiple Vitamin) .... Take 1 Tablet By Mouth Once A Day 8)  Fish Oil  Oil (Fish Oil) .... Take 1 Capsule By Mouth Once A Day 9)  Vitamin D3 1000 Unit Caps (Cholecalciferol) .... Take 1 Capsule By Mouth Once A Day 10)  Red Yeast Rice Extract 600 Mg Caps (Red Yeast Rice Extract) .... Take 2 Caps By Mouth At Bedtime 11)  Co Q-10 100 Mg Caps (Coenzyme Q10) .... Take 1 Capsule By Mouth Once A Day 12)  Grape Seed Extract 100 Mg Caps (Grape Seed) .... Take 2 Caps By Mouth Once Daily 13)  Dulcolax 5 Mg Tbec (Bisacodyl) .... Take 1 Tablet By Mouth Once A Day  Allergies (verified): 1)  Ex-Lax (Sennosides)  Past History:  Past Medical History: Reviewed history from 07/09/2009 and no changes required. Diabetes Hypertension Osteoarthritis GERD Carcinosarcoma of Uterus  Past Surgical History: Hysterectomy 03/2008 Bilateral salpingo-oophorectomy Pelvic and periaortic lymphadecectomy Umbilical  Hernia Tonsillectomy Cataract Extraction Left   Family History: Lung Cancer-mother COPD-father No FH of Colon Cancer: Family History of Diabetes: Father-dx 39 yo  Social History: Divorced, 1 girl (adopted) Retired Runner, broadcasting/film/video Patient is a former smoker. Quit 1976 Alcohol Use -  no Daily Caffeine Use 3 cups coffee every other day  Illicit Drug Use - no Drug Use:  no  Review of Systems  The patient denies allergy/sinus, anemia, anxiety-new, arthritis/joint pain, back pain, blood in urine, breast changes/lumps, change in vision, confusion, cough, coughing up blood, depression-new, fainting, fatigue, fever, headaches-new, hearing problems, heart murmur, heart rhythm changes, itching, menstrual pain, muscle pains/cramps, night sweats, nosebleeds, pregnancy symptoms, shortness of breath, skin rash, sleeping problems, sore throat, swelling of feet/legs, swollen lymph glands, thirst - excessive, urination - excessive, urination changes/pain, urine leakage, and voice change.    Vital Signs:  Patient profile:   70 year old female Height:      64 inches Weight:      217 pounds BMI:     37.38 Pulse rate:   92 / minute Pulse rhythm:   regular BP sitting:   122 / 80  (left arm) Cuff size:   regular  Vitals Entered By: Francee Piccolo CMA Duncan Dull) (Jul 13, 2009 2:07 PM)  Physical Exam  General:  Well developed, well nourished, no acute distress. Head:  Normocephalic and atraumatic. Eyes:  PERRLA, no icterus. Nose:  No deformity, discharge,  or lesions. Mouth:  No deformity or lesions, Neck:  Supple; no masses or thyromegaly. Lungs:  Clear throughout to auscultation. Heart:  Regular rate and rhythm; no murmurs, rubs,  or bruits. Abdomen:  Soft, nontender and nondistended. No masses, hepatosplenomegaly or hernias noted. Normal bowel sounds. Rectal:  deferred Msk:  Symmetrical with no gross deformities. Normal posture. Pulses:  Normal pulses noted. Extremities:  No clubbing, cyanosis, edema or deformities noted. Neurologic:  Alert and  oriented x4;  grossly normal neurologically. Skin:  Intact without significant lesions or rashes. Cervical Nodes:  No significant cervical adenopathy.no cervical clavicular adenopathy Psych:  Alert and cooperative. Normal mood and  affect.   Impression & Recommendations:  Problem # 1:  GERD (ICD-530.81) several month history of GERD symptoms. This in conjunction with 17 pound weight gain.  Plan: #1. Reflux precautions with attention to weight loss #2. Discussion on GERD and reflux precautions. Supplemental literature for her review provided. #3. Continue PPI therapy to control symptoms  Problem # 2:  DYSPHAGIA (ICD-787.29) intermittent pill and solid food dysphagia. Improved on PPI. Suspect esophageal edema do esophagitis and/or underlying stricture.  Plan: #1. Upper endoscopy with possible esophageal dilation. The nature of the procedure as well as the risks, benefits, and alternatives were reviewed. She understood and agreed to proceed. #2. Hold diabetic medications the morning of the procedure until the patient resumed normal p.o. intake. This in order to avoid unwanted hypoglycemia.Monitor her blood sugar immediately before and after her procedure.  Problem # 3:  SCREENING COLORECTAL-CANCER (ICD-V76.51) due for followup screening colonoscopy.  Plan: #1. Colonoscopy. The nature of the procedure as well as the risks, benefits, and alternatives were reviewed. She understood and agreed to proceed #2. Movi prep prescribed. The patient instructed on its use. #3. Drink plenty of water and take to extra Ducolax the morning prior to the procedure in order to increase the likelihood of a good prep #4. Hold diabetic medications preprocedure as previously described  Problem # 4:  DIABETES MELLITUS-TYPE II (ICD-250.00) adjust diabetic medications preprocedure as above  Other Orders: Colon/Endo (Colon/Endo)  Patient Instructions: 1)  Colon/Endo [facility] 08/17/09 2:00 pm 2)  Movi prep instructions given to patient.  3)  Movi prep Rx. sent to pharmacy. 4)  Colonoscopy and Flexible Sigmoidoscopy brochure given.  5)  Upper Endoscopy brochure given.  6)  Hold oral diabetic meds morning of procedure. 7)  Copy sent to :  Geoffry Paradise, MD 8)  The medication list was reviewed and reconciled.  All changed / newly prescribed medications were explained.  A complete medication list was provided to the patient / caregiver. 9)  printed and given to pt. Milford Cage [facility]  Jul 13, 2009 2:47 PM Prescriptions: MOVIPREP 100 GM  SOLR (PEG-KCL-NACL-NASULF-NA ASC-C) As per prep instructions.  #1 x 0   Entered by:   Milford Cage NCMA   Authorized by:   Hilarie Fredrickson MD   Signed by:   Milford Cage NCMA on 07/13/2009   Method used:   Electronically to        [facility]  [phone] (retail)       [address]       [place], [place]  [address]       Ph: [phone] or [phone]       Fax: [phone]   RxID:   [id number]

## 2010-03-24 NOTE — Letter (Signed)
Summary: Diabetic Instructions  [facility]  [address] [place] [employer] [place], [place] [address]   Phone: [phone]  Fax: [phone]    NONNA RENNINGER 01-11-1941 MRN: [id number]   X   ORAL DIABETIC MEDICATION INSTRUCTIONS  The day before your procedure:   Take your diabetic pill as you do normally  The day of your procedure:   Do not take your diabetic pill    We will check your blood sugar levels during the admission process and again in Recovery before discharging you home  ________________________________________________________________________

## 2010-03-24 NOTE — Procedures (Signed)
Summary: Colonoscopy  Patient: Lori Little Note: All result statuses are Final unless otherwise noted.  Tests: (1) Colonoscopy (COL)   COL Colonoscopy           DONE     [facility]     [address]     [place], [place]  [address]           COLONOSCOPY PROCEDURE REPORT           PATIENT:  [name], [name]  MR#:  [id number]     BIRTHDATE:  1940-04-26, 69 yrs. old  GENDER:  female     ENDOSCOPIST:  Wilhemina Bonito. Eda Keys, MD     REF. BY:  Screening Recall     PROCEDURE DATE:  08/17/2009     PROCEDURE:  Average-risk screening colonoscopy     G0121     ASA CLASS:  Class II     INDICATIONS:  Routine Risk Screening     MEDICATIONS:   Fentanyl 100 mcg IV, Versed 10 mg IV           DESCRIPTION OF PROCEDURE:   After the risks benefits and     alternatives of the procedure were thoroughly explained, informed     consent was obtained.  Digital rectal exam was performed and     revealed no abnormalities.   The LB CF-[id number] endoscope     was introduced through the anus and advanced to the cecum, which     was identified by both the appendix and ileocecal valve, without     limitations.TIME TO CECUM = 10:02 (REDUNDANT COLON) The quality of     the prep was good, using MoviPrep.  The instrument was then slowly     withdrawn (TIME = 13:04 MIN) as the colon was fully examined.     <<PROCEDUREIMAGES>>           FINDINGS:  A normal appearing cecum, ileocecal valve, and     appendiceal orifice were identified. The ascending, hepatic     flexure, transverse, splenic flexure, descending, sigmoid colon,     and rectum appeared unremarkable.  No polyps or cancers were seen.     Retroflexed views in the rectum revealed internal hemorrhoids.     The scope was then withdrawn from the patient and the procedure     completed.           COMPLICATIONS:  None     ENDOSCOPIC IMPRESSION:     1) Normal colon     2) No polyps or cancers     3) Internal hemorrhoids            RECOMMENDATIONS:     1) Continue current colorectal screening recommendations for     "routine risk" patients with a repeat colonoscopy in 10 years.           ______________________________     Wilhemina Bonito. Eda Keys, MD           CC:  Geoffry Paradise, MD;  The Patient           n.     eSIGNED:   Wilhemina Bonito. Eda Keys at 08/17/2009 03:04 PM           Vergia Alcon, [id number]  Note: An exclamation mark (!) indicates a result that was not dispersed into the flowsheet. Document Creation Date: 08/17/2009 3:04 PM _______________________________________________________________________  (1) Order result status: Final Collection or observation date-time: 08/17/2009 14:59 Requested date-time:  Receipt  date-time:  Reported date-time:  Referring Physician:   Ordering Physician: Fransico Setters [phone]) Specimen Source:  Source: [name] Order Number: [id number] Lab site:   Appended Document: Colonoscopy reports mailed to Dr. Loree Fee ,Dr. Varney Baas per patient instruction.

## 2010-03-24 NOTE — Letter (Signed)
Summary: [facility] Instructions  [facility]  [address] [place], [place] [address]   Phone: [phone]  Fax: [phone]       Lori Little    04-11-1940    MRN: [id number]        Procedure Day /Date:TUESDAY, 08/17/09     Arrival Time:1:00 PM     Procedure Time:2:00 PM     Location of Procedure:                    X  [facility] (4th Floor)   PREPARATION FOR COLONOSCOPY WITH MOVIPREP   Starting 5 days prior to your procedure 6/23/11do not eat nuts, seeds, popcorn, corn, beans, peas,  salads, or any raw vegetables.  Do not take any fiber supplements (e.g. Metamucil, Citrucel, and Benefiber).  THE DAY BEFORE YOUR PROCEDURE         DATE: 08/16/09 DAY: MONDAY  1.  Drink clear liquids the entire day-NO SOLID FOOD  2.  Do not drink anything colored red or purple.  Avoid juices with pulp.  No orange juice.  3.  Drink at least 64 oz. (8 glasses) of fluid/clear liquids during the day to prevent dehydration and help the prep work efficiently.  CLEAR LIQUIDS INCLUDE: Water Jello Ice Popsicles Tea (sugar ok, no milk/cream) Powdered fruit flavored drinks Coffee (sugar ok, no milk/cream) Gatorade Juice: apple, white grape, white cranberry  Lemonade Clear bullion, consomm, broth Carbonated beverages (any kind) Strained chicken noodle soup Hard Candy                             4.  In the morning, mix first dose of MoviPrep solution:    Empty 1 Pouch A and 1 Pouch B into the disposable container    Add lukewarm drinking water to the top line of the container. Mix to dissolve    Refrigerate (mixed solution should be used within 24 hrs)  5.  Begin drinking the prep at 5:00 p.m. The MoviPrep container is divided by 4 marks.   Every 15 minutes drink the solution down to the next mark (approximately 8 oz) until the full liter is complete.   6.  Follow completed prep with 16 oz of clear liquid of your choice (Nothing red or purple).  Continue to  drink clear liquids until bedtime.  7.  Before going to bed, mix second dose of MoviPrep solution:    Empty 1 Pouch A and 1 Pouch B into the disposable container    Add lukewarm drinking water to the top line of the container. Mix to dissolve    Refrigerate  THE DAY OF YOUR PROCEDURE      DATE: 08/17/09 DAY: TUESDAY Beginning at 9:00 a.m. (5 hours before procedure):         1. Every 15 minutes, drink the solution down to the next mark (approx 8 oz) until the full liter is complete.  2. Follow completed prep with 16 oz. of clear liquid of your choice.    3. You may drink clear liquids until12 N  (2 HOURS BEFORE PROCEDURE).   MEDICATION INSTRUCTIONS  Unless otherwise instructed, you should take regular prescription medications with a small sip of water   as early as possible the morning of your procedure.  Diabetic patients - see separate instructions.           OTHER INSTRUCTIONS  You will need a responsible adult  at least 69 years of age to accompany you and drive you home.   This person must remain in the waiting room during your procedure.  Wear loose fitting clothing that is easily removed.  Leave jewelry and other valuables at home.  However, you may wish to bring a book to read or  an iPod/MP3 player to listen to music as you wait for your procedure to start.  Remove all body piercing jewelry and leave at home.  Total time from sign-in until discharge is approximately 2-3 hours.  You should go home directly after your procedure and rest.  You can resume normal activities the  day after your procedure.  The day of your procedure you should not:   Drive   Make legal decisions   Operate machinery   Drink alcohol   Return to work  You will receive specific instructions about eating, activities and medications before you leave.    The above instructions have been reviewed and explained to me by   _______________________    I fully understand and  can verbalize these instructions _____________________________ Date _________

## 2010-03-24 NOTE — Letter (Signed)
Summary: New Patient letter  [facility]  [address] [place], [place] [address]   Phone: [phone]  Fax: [phone]       06/07/2009 MRN: [id number]  [facility] [address] [place], [place]  [address]  Dear Lori Little,  Welcome to the Gastroenterology Division at [facility].    You are scheduled to see Dr. Marina Goodell on 07-13-09 at 1:45p.m. on the 3rd floor at [facility], [address]  We ask that you try to arrive at our office 15 minutes prior to your appointment time to allow for check-in.  We would like you to complete the enclosed self-administered evaluation form prior to your visit and bring it with you on the day of your appointment.  We will review it with you.  Also, please bring a complete list of all your medications or, if you prefer, bring the medication bottles and we will list them.  Please bring your insurance card so that we may make a copy of it.  If your insurance requires a referral to see a specialist, please bring your referral form from your primary care physician.  Co-payments are due at the time of your visit and may be paid by cash, check or credit card.     Your office visit will consist of a consult with your physician (includes a physical exam), any laboratory testing he/she may order, scheduling of any necessary diagnostic testing (e.g. x-ray, ultrasound, CT-scan), and scheduling of a procedure (e.g. Endoscopy, Colonoscopy) if required.  Please allow enough time on your schedule to allow for any/all of these possibilities.    If you cannot keep your appointment, please call [phone] to cancel or reschedule prior to your appointment date.  This allows [place] the opportunity to schedule an appointment for another patient in need of care.  If you do not cancel or reschedule by 5 p.m. the business day prior to your appointment date, you will be charged a $50.00 late cancellation/no-show fee.    Thank you for choosing  [facility] for your medical needs.  We appreciate the opportunity to care for you.  Please visit [place] at our website  to learn more about our practice.                     Sincerely,                                                             The Gastroenterology Division

## 2010-03-24 NOTE — Letter (Signed)
Summary: Colonoscopy Letter  [facility]  [address] [place], [place] [address]   Phone: [phone]  Fax: [phone]      April 23, 2009 MRN: [id number]   [facility] [address] [place], [place]  [address]   Dear Ms. Zella Ball,   According to your medical record, it is time for you to schedule a Colonoscopy. The American Cancer Society recommends this procedure as a method to detect early colon cancer. Patients with a family history of colon cancer, or a personal history of colon polyps or inflammatory bowel disease are at increased risk.  This letter has been generated based on the recommendations made at the time of your procedure. If you feel that in your particular situation this may no longer apply, please contact our office.  Please call our office at [phone] to schedule this appointment or to update your records at your earliest convenience.  Thank you for cooperating with [place] to provide you with the very best care possible.   Sincerely,  Wilhemina Bonito. Marina Goodell, M.D.  [facility] Gastroenterology Division [phone]

## 2010-05-06 ENCOUNTER — Ambulatory Visit: Payer: Medicare Other | Attending: Gynecology | Admitting: Gynecology

## 2010-05-06 ENCOUNTER — Other Ambulatory Visit: Payer: Self-pay | Admitting: Gynecology

## 2010-05-06 ENCOUNTER — Other Ambulatory Visit: Payer: Self-pay | Admitting: Oncology

## 2010-05-06 ENCOUNTER — Other Ambulatory Visit (HOSPITAL_COMMUNITY)
Admission: RE | Admit: 2010-05-06 | Discharge: 2010-05-06 | Disposition: A | Discharge status: Home / Self Care | Payer: Medicare Other | Source: Ambulatory Visit | Attending: Gynecology | Admitting: Gynecology

## 2010-05-06 DIAGNOSIS — Z9221 Personal history of antineoplastic chemotherapy: Secondary | ICD-10-CM | POA: Insufficient documentation

## 2010-05-06 DIAGNOSIS — Z9079 Acquired absence of other genital organ(s): Secondary | ICD-10-CM | POA: Insufficient documentation

## 2010-05-06 DIAGNOSIS — E119 Type 2 diabetes mellitus without complications: Secondary | ICD-10-CM | POA: Insufficient documentation

## 2010-05-06 DIAGNOSIS — Z801 Family history of malignant neoplasm of trachea, bronchus and lung: Secondary | ICD-10-CM | POA: Insufficient documentation

## 2010-05-06 DIAGNOSIS — Z90710 Acquired absence of both cervix and uterus: Secondary | ICD-10-CM | POA: Insufficient documentation

## 2010-05-06 DIAGNOSIS — C55 Malignant neoplasm of uterus, part unspecified: Secondary | ICD-10-CM | POA: Insufficient documentation

## 2010-05-06 DIAGNOSIS — Z8540 Personal history of malignant neoplasm of unspecified female genital organ: Secondary | ICD-10-CM | POA: Insufficient documentation

## 2010-05-06 DIAGNOSIS — C549 Malignant neoplasm of corpus uteri, unspecified: Secondary | ICD-10-CM

## 2010-05-06 DIAGNOSIS — I10 Essential (primary) hypertension: Secondary | ICD-10-CM | POA: Insufficient documentation

## 2010-05-06 DIAGNOSIS — E039 Hypothyroidism, unspecified: Secondary | ICD-10-CM | POA: Insufficient documentation

## 2010-05-06 DIAGNOSIS — E669 Obesity, unspecified: Secondary | ICD-10-CM | POA: Insufficient documentation

## 2010-05-06 LAB — BUN: BUN: 14 mg/dL (ref 6–23)

## 2010-05-06 LAB — CREATININE, SERUM
Creatinine, Ser: 0.9 mg/dL (ref 0.4–1.2)
GFR calc Af Amer: >60 mL/min (ref >60)
GFR calc non Af Amer: >60 mL/min (ref >60)

## 2010-05-08 LAB — GLUCOSE, CAPILLARY
Glucose-Capillary: 104 mg/dL — ABNORMAL HIGH (ref 70–99)
Glucose-Capillary: 125 mg/dL — ABNORMAL HIGH (ref 70–99)

## 2010-05-10 ENCOUNTER — Other Ambulatory Visit (HOSPITAL_COMMUNITY): Payer: Medicare Other

## 2010-05-12 ENCOUNTER — Ambulatory Visit (HOSPITAL_COMMUNITY)
Admission: RE | Admit: 2010-05-12 | Discharge: 2010-05-12 | Disposition: A | Discharge status: Home / Self Care | Payer: Medicare Other | Source: Ambulatory Visit | Attending: Oncology | Admitting: Oncology

## 2010-05-12 ENCOUNTER — Encounter (HOSPITAL_COMMUNITY): Payer: Self-pay

## 2010-05-12 DIAGNOSIS — K7689 Other specified diseases of liver: Secondary | ICD-10-CM | POA: Insufficient documentation

## 2010-05-12 DIAGNOSIS — Z90710 Acquired absence of both cervix and uterus: Secondary | ICD-10-CM | POA: Insufficient documentation

## 2010-05-12 DIAGNOSIS — J984 Other disorders of lung: Secondary | ICD-10-CM | POA: Insufficient documentation

## 2010-05-12 DIAGNOSIS — C549 Malignant neoplasm of corpus uteri, unspecified: Secondary | ICD-10-CM | POA: Insufficient documentation

## 2010-05-12 HISTORY — DX: Essential (primary) hypertension: I10

## 2010-05-12 HISTORY — DX: Malignant neoplasm of uterus, part unspecified: C55

## 2010-05-12 MED ORDER — IOHEXOL 300 MG/ML  SOLN
125.0000 mL | Freq: Once | INTRAMUSCULAR | Status: AC | PRN
  Administered 2010-05-12: 125 mL via INTRAVENOUS

## 2010-05-13 NOTE — Consult Note (Signed)
  NAMEKYARRA, Lori Little               ACCOUNT NO.:  [id number]  MEDICAL RECORD NO.:  [id number]           PATIENT TYPE:  LOCATION:                                 FACILITY:  PHYSICIAN:  De Blanch, M.D.DATE OF BIRTH:  [birth year]  DATE OF CONSULTATION: DATE OF DISCHARGE:                                CONSULTATION   CHIEF COMPLAINT:  Carcinosarcoma of the uterus.  INTERVAL HISTORY:  The patient returns today for routine scheduled followup.  Since her last visit, she has done well.  She denies any GI or GU symptoms.  Has no pelvic pain, pressure, vaginal bleed or discharge.  Functional status is excellent.  HISTORY OF PRESENT ILLNESS:  The patient had a carcinosarcoma of the uterus, undergoing TAH-BSO, pelvic and periaortic lymphadenectomy February 2010.  Final pathology showed tumor involving the entire endometrial cavity and lower uterine segment with deep myometrial invasion.  2 of 12 lymph nodes were positive on the left sidewall.  She received 6 cycles of carboplatin and Taxol postoperatively, completed in July 2010.  Followup CT scan at that time showed lesion on the left pelvic sidewall.  This was biopsied and found to be negative.  PAST MEDICAL HISTORY:  Medical illnesses, obesity, diabetes, hypertension, plantar fasciitis and hypothyroidism.  CURRENT MEDICATIONS:  Synthroid, Benicar, Fortamet ER and baby aspirin.  PAST SURGICAL HISTORY:  Umbilical hernia repair, ganglion cyst at the right wrist, tonsils and adenoidectomy, TAH-BSO, pelvic and cardiac lymphadenectomy.  DRUG ALLERGIES:  EX-LAX CAUSED HER TO HAVE HIVES.  SOCIAL HISTORY:  The patient is divorced.  She is a retired Runner, broadcasting/film/video. Does not smoke.  FAMILY HISTORY:  Mother had lung cancer.  OBSTETRICAL HISTORY:  Gravida 0.  REVIEW OF SYSTEMS:  10-point comprehensive review of systems negative except as noted above.  PHYSICAL EXAMINATION:  VITAL SIGNS:  Weight 207 pounds, blood  pressure 122/60. GENERAL:  The patient is a healthy obese white female in no acute distress. HEENT:  Negative. NECK:  Supple without thyromegaly.  There is no supraclavicular or inguinal adenopathy. ABDOMEN:  Soft, nontender.  No mass, organomegaly, ascites or hernias noted. PELVIC:  EGBUS, vagina, urethra are normal.  Cervix and uterus are surgically absent.  Adnexa without masses.  Rectovaginal exam confirms. EXTREMITIES:  Lower extremities without edema or varicosities.  IMPRESSION:  Stage IIIC1 carcinosarcoma of the uterus.  The patient is clinically free of disease.  PLAN:  We will obtain a followup CT scan in the near future.  As long as this is negative, will then continue to follow the patient with office visits and physical exams every 6 months.     De Blanch, M.D.     DC/MEDQ  D:  05/06/2010  T:  05/06/2010  Job:  [id number]  cc:   Telford Nab, R.N. [address] [place], [place] [address]  W. Varney Baas, M.D. Fax: [phone]  Lennis P. Darrold Span, M.D. Fax: [phone]  Geoffry Paradise, M.D. Fax: [phone]  Electronically Signed by De Blanch M.D. on 05/10/2010 01:27:59 PM

## 2010-05-28 LAB — GLUCOSE, CAPILLARY
Glucose-Capillary: 100 mg/dL — ABNORMAL HIGH (ref 70–99)
Glucose-Capillary: 110 mg/dL — ABNORMAL HIGH (ref 70–99)

## 2010-06-07 LAB — COMPREHENSIVE METABOLIC PANEL
ALT: 24 U/L (ref 0–35)
AST: 25 U/L (ref 0–37)
Albumin: 4.3 g/dL (ref 3.5–5.2)
Alkaline Phosphatase: 61 U/L (ref 39–117)
BUN: 16 mg/dL (ref 6–23)
CO2: 32 mEq/L (ref 19–32)
Calcium: 10.2 mg/dL (ref 8.4–10.5)
Chloride: 102 mEq/L (ref 96–112)
Creatinine, Ser: 0.8 mg/dL (ref 0.4–1.2)
GFR calc Af Amer: >60 mL/min (ref >60)
GFR calc non Af Amer: >60 mL/min (ref >60)
Glucose, Bld: 189 mg/dL — ABNORMAL HIGH (ref 70–99)
Potassium: 4.4 mEq/L (ref 3.5–5.1)
Sodium: 143 mEq/L (ref 135–145)
Total Bilirubin: 1.3 mg/dL — ABNORMAL HIGH (ref 0.3–1.2)
Total Protein: 7 g/dL (ref 6.0–8.3)

## 2010-06-07 LAB — BASIC METABOLIC PANEL
BUN: 5 mg/dL — ABNORMAL LOW (ref 6–23)
CO2: 29 mEq/L (ref 19–32)
Calcium: 8.3 mg/dL — ABNORMAL LOW (ref 8.4–10.5)
Chloride: 106 mEq/L (ref 96–112)
Creatinine, Ser: 0.64 mg/dL (ref 0.4–1.2)
GFR calc Af Amer: >60 mL/min (ref >60)
GFR calc non Af Amer: >60 mL/min (ref >60)
Glucose, Bld: 151 mg/dL — ABNORMAL HIGH (ref 70–99)
Potassium: 4 mEq/L (ref 3.5–5.1)
Sodium: 139 mEq/L (ref 135–145)

## 2010-06-07 LAB — DIFFERENTIAL
Basophils Absolute: 0 10*3/uL (ref 0.0–0.1)
Basophils Relative: 0 % (ref 0–1)
Eosinophils Absolute: 0.1 10*3/uL (ref 0.0–0.7)
Eosinophils Relative: 1 % (ref 0–5)
Lymphocytes Relative: 18 % (ref 12–46)
Lymphs Abs: 1.5 10*3/uL (ref 0.7–4.0)
Monocytes Absolute: 0.6 10*3/uL (ref 0.1–1.0)
Monocytes Relative: 7 % (ref 3–12)
Neutro Abs: 6.6 10*3/uL (ref 1.7–7.7)
Neutrophils Relative %: 75 % (ref 43–77)

## 2010-06-07 LAB — CBC
HCT: 43.9 % (ref 36.0–46.0)
HCT: 35.8 % — ABNORMAL LOW (ref 36.0–46.0)
Hemoglobin: 14.7 g/dL (ref 12.0–15.0)
Hemoglobin: 12 g/dL (ref 12.0–15.0)
MCHC: 33.4 g/dL (ref 30.0–36.0)
MCHC: 33.4 g/dL (ref 30.0–36.0)
MCV: 92.3 fL (ref 78.0–100.0)
MCV: 92 fL (ref 78.0–100.0)
Platelets: 258 10*3/uL (ref 150–400)
Platelets: 201 10*3/uL (ref 150–400)
RBC: 4.75 MIL/uL (ref 3.87–5.11)
RBC: 3.89 MIL/uL (ref 3.87–5.11)
RDW: 12.9 % (ref 11.5–15.5)
RDW: 12.8 % (ref 11.5–15.5)
WBC: 8.9 10*3/uL (ref 4.0–10.5)
WBC: 8.6 10*3/uL (ref 4.0–10.5)

## 2010-06-07 LAB — TYPE AND SCREEN
ABO/RH(D): A NEG
Antibody Screen: NEGATIVE

## 2010-06-07 LAB — GLUCOSE, CAPILLARY
Glucose-Capillary: 100 mg/dL — ABNORMAL HIGH (ref 70–99)
Glucose-Capillary: 109 mg/dL — ABNORMAL HIGH (ref 70–99)
Glucose-Capillary: 117 mg/dL — ABNORMAL HIGH (ref 70–99)
Glucose-Capillary: 118 mg/dL — ABNORMAL HIGH (ref 70–99)
Glucose-Capillary: 123 mg/dL — ABNORMAL HIGH (ref 70–99)
Glucose-Capillary: 137 mg/dL — ABNORMAL HIGH (ref 70–99)
Glucose-Capillary: 137 mg/dL — ABNORMAL HIGH (ref 70–99)
Glucose-Capillary: 141 mg/dL — ABNORMAL HIGH (ref 70–99)
Glucose-Capillary: 144 mg/dL — ABNORMAL HIGH (ref 70–99)
Glucose-Capillary: 153 mg/dL — ABNORMAL HIGH (ref 70–99)
Glucose-Capillary: 156 mg/dL — ABNORMAL HIGH (ref 70–99)
Glucose-Capillary: 159 mg/dL — ABNORMAL HIGH (ref 70–99)
Glucose-Capillary: 161 mg/dL — ABNORMAL HIGH (ref 70–99)
Glucose-Capillary: 170 mg/dL — ABNORMAL HIGH (ref 70–99)
Glucose-Capillary: 182 mg/dL — ABNORMAL HIGH (ref 70–99)
Glucose-Capillary: 190 mg/dL — ABNORMAL HIGH (ref 70–99)
Glucose-Capillary: 202 mg/dL — ABNORMAL HIGH (ref 70–99)
Glucose-Capillary: 80 mg/dL (ref 70–99)
Glucose-Capillary: 93 mg/dL (ref 70–99)
Glucose-Capillary: 94 mg/dL (ref 70–99)

## 2010-06-07 LAB — ABO/RH: ABO/RH(D): A NEG

## 2010-06-10 IMAGING — CT CT BIOPSY
2 of 7 series · 13 of 32 positions shown, 18 images · non-contrast
Comparison: none

CLINICAL DATA: History of uterine carcinosarcoma.  The asymmetric
soft tissue is present in the left pelvic sidewall by CT.  The
patient presents for biopsy.

[Series 2: pelvic mass st · axial · 0.96mm/px · z∈[-290,-226]mm · 7 of 19 slices shown, 12 images]
[im 3/19  soft-tissue]
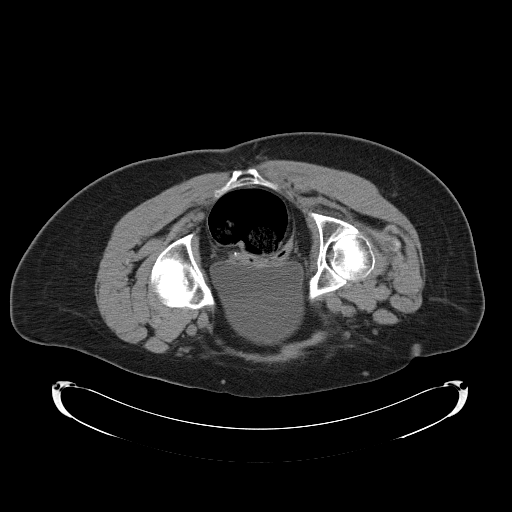
[im 3/19  bone]
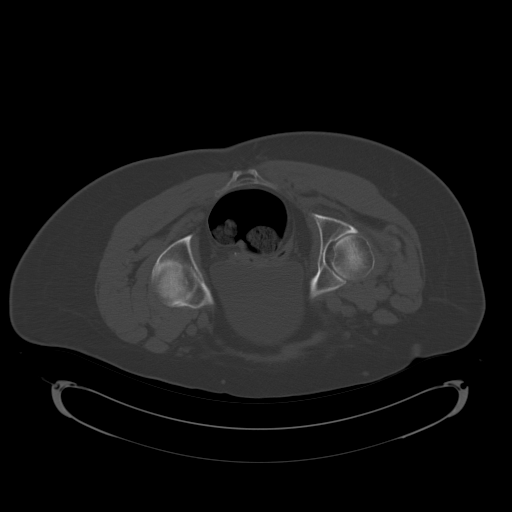
[im 5/19  soft-tissue]
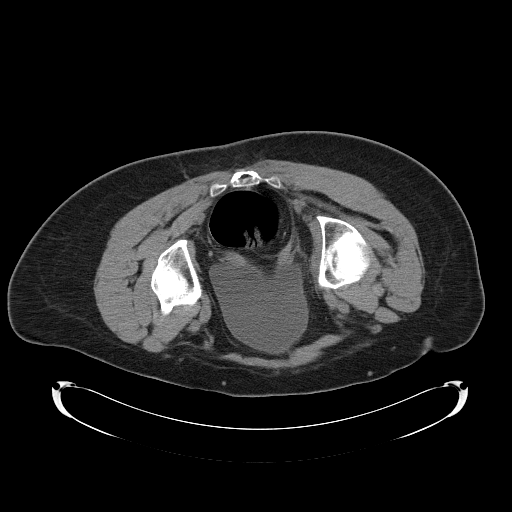
[im 7/19  soft-tissue]
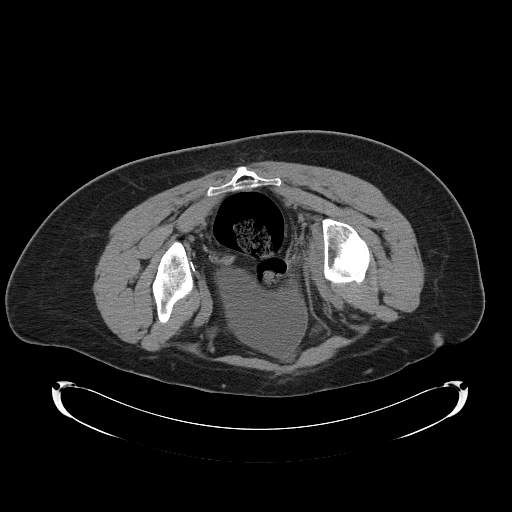
[im 10/19  soft-tissue]
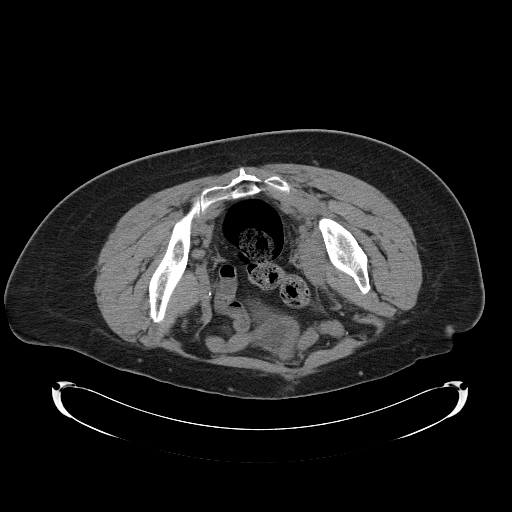
[im 10/19  lung]
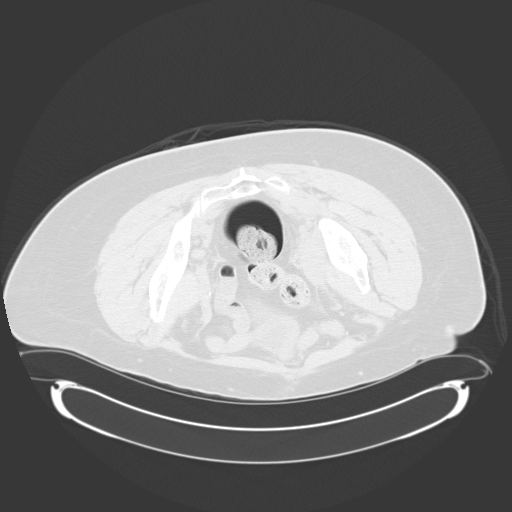
[im 12/19  soft-tissue]
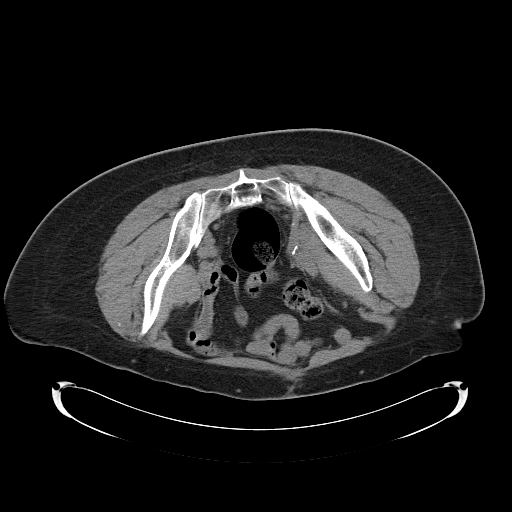
[im 12/19  lung]
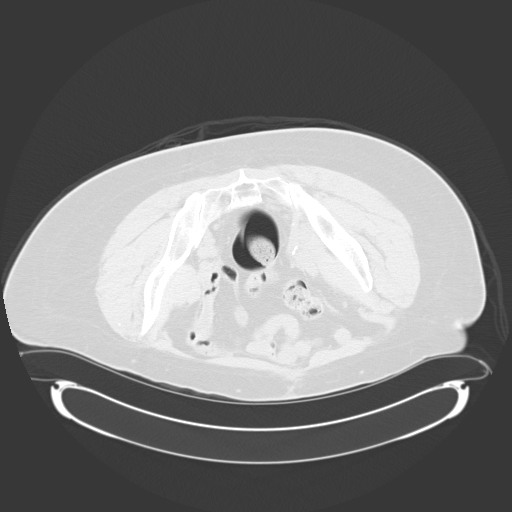
[im 14/19  soft-tissue]
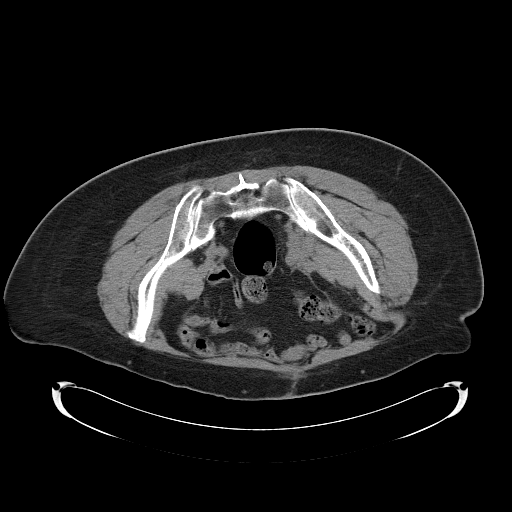
[im 14/19  lung]
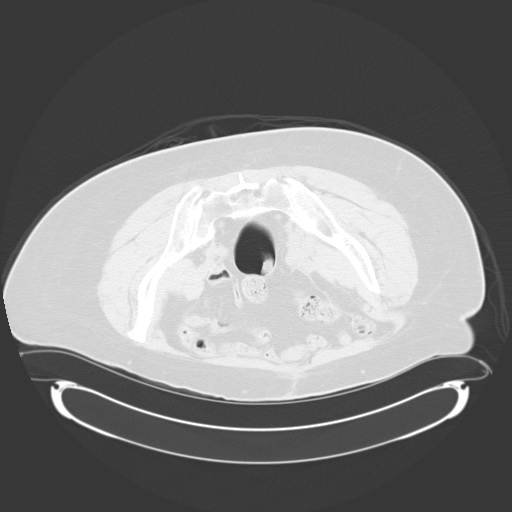
[im 16/19  soft-tissue]
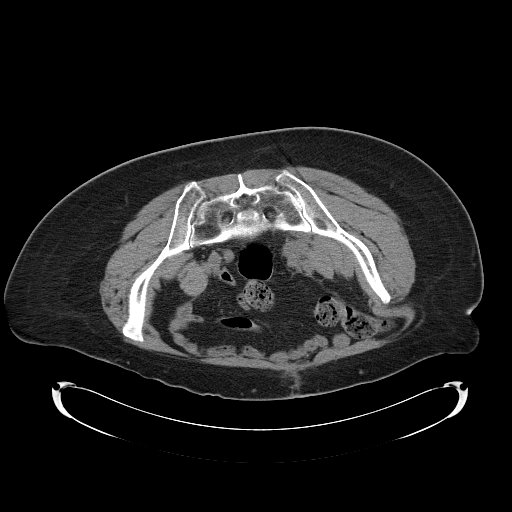
[im 16/19  lung]
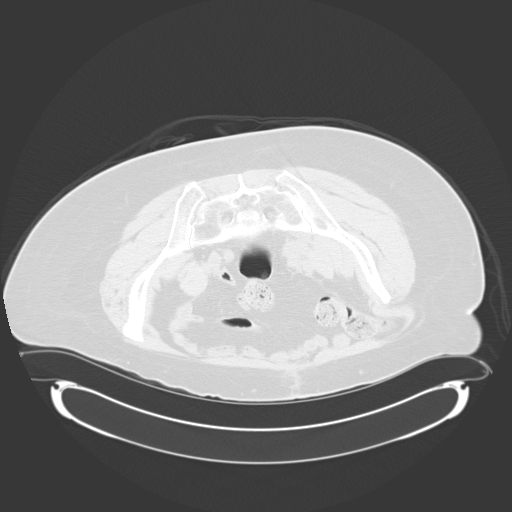

[Series 3: biopsy 6.0 b30f · axial · 0.96mm/px · z∈[-267,-255]mm · 6 of 15 slices shown]
[im 3/15  soft-tissue]
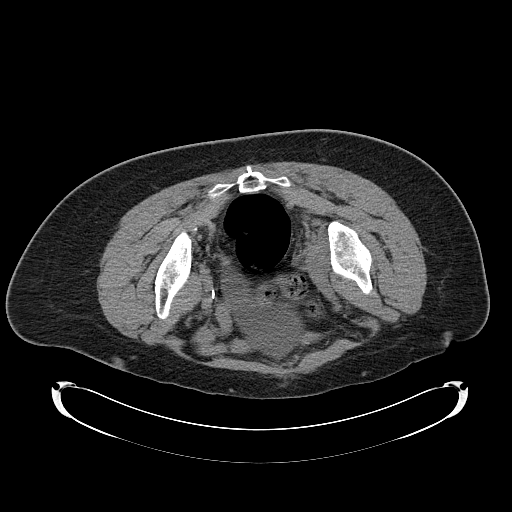
[im 5/15  soft-tissue]
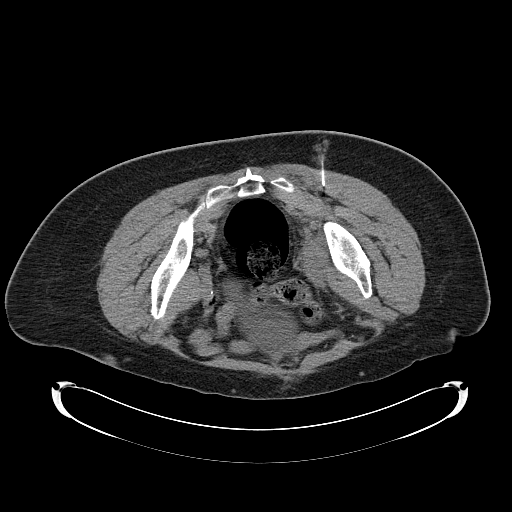
[im 7/15  soft-tissue]
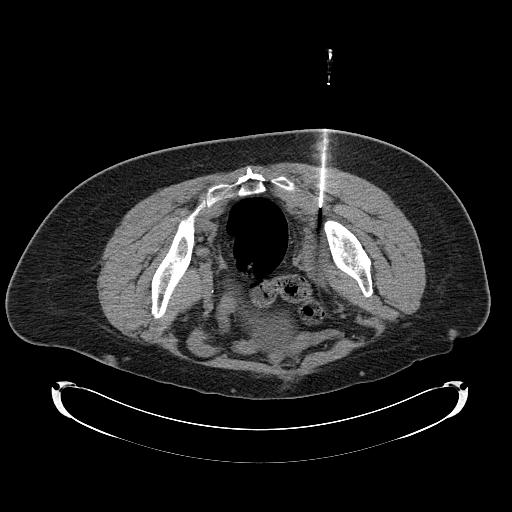
[im 9/15  soft-tissue]
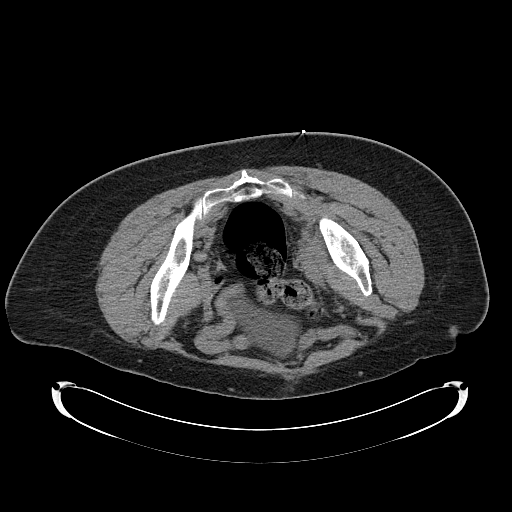
[im 11/15  soft-tissue]
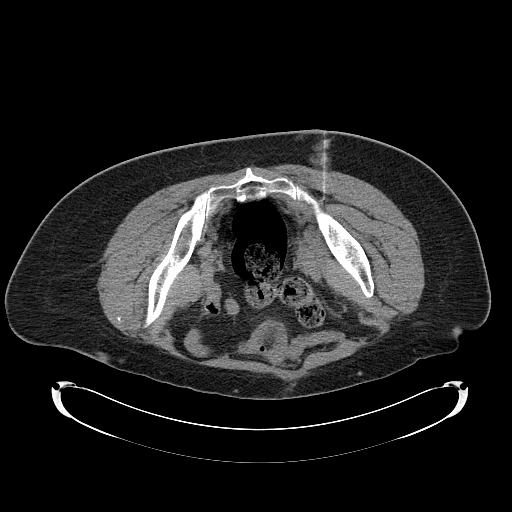
[im 13/15  soft-tissue]
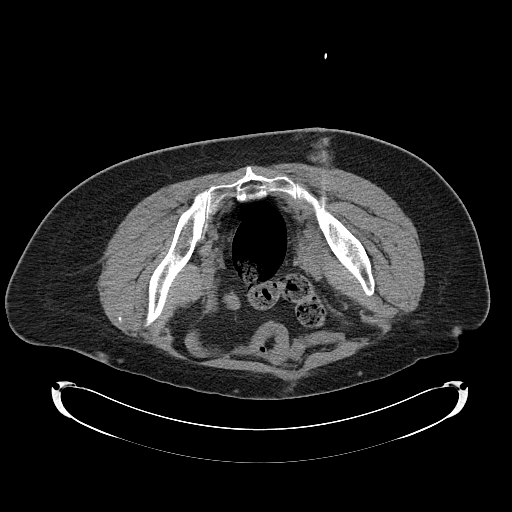

[13 of 32 positions shown; findings below may reference images not displayed]

CT GUIDED NEEDLE ASPIRATE AND CORE BIOPSY OF LEFT PELVIC
RETROPERITONEAL SOFT TISSUE MASS

Sedation: Versed 3.0 mg IV, Fentanyl 100 mcg IV

Total Moderate Sedation Time: 30 minutes.

Procedure:  The procedure risks, benefits, and alternatives were
explained to the patient.  Questions regarding the procedure were
encouraged and answered.  The patient understands and consents to
the procedure.

The posterior left pelvis was prepped with betadine in a sterile
fashion, and a sterile drape was applied covering the operative
field.  A sterile gown and sterile gloves were used for the
procedure.  Local anesthesia was provided with 1% Lidocaine.

From a transgluteal approach, a 17 gauge needle was advanced under
CT guidance to the level of left pelvic sidewall tissue located in
an extraperitoneal location.  Initial coaxial needle aspirate
samples were obtained with 22 gauge [employer] Joshjax.  A quick stain
was performed.  Core biopsy was then performed with an 18 gauge
device.  A total of 3 core biopsy samples were submitted in
formalin.

Complications: None
FINDINGS: Tissue at the level of the left pelvic sidewall was very
firm and fibrotic with needle passage.  Initial aspirate analysis
by quick stain revealed mostly fibrotic tissue with no definite
malignant cells identified.  Core biopsy was therefore performed to
facilitate tissue evaluation.
IMPRESSION: CT guided needle aspirate and core biopsy of left pelvic sidewall
tissue.

## 2010-07-05 NOTE — Consult Note (Signed)
Lori Little, Lori Little               ACCOUNT NO.:  [id number]   MEDICAL RECORD NO.:  [id number]          PATIENT TYPE:  OUT   LOCATION:  GYN                          FACILITY:  [facility]   PHYSICIAN:  De Blanch, M.D.DATE OF BIRTH:  [birth year]   DATE OF CONSULTATION:  DATE OF DISCHARGE:                                 CONSULTATION   CHIEF COMPLAINT:  Carcinosarcoma of the uterus.   INTERVAL HISTORY:  The patient returns today for first postoperative  checkup and to discuss treatment planning.  She underwent a total  abdominal hysterectomy with bilateral salpingo-oophorectomy and pelvic  and para-aortic lymphadenectomy on April 07, 2008.  Final pathology  showed a carcinosarcoma invading deeply into the myometrium with  lymphovascular space involvement.  In addition, she had 2 positive  pelvic lymph nodes.  Peritoneal washings were negative.  The patient has  had an uncomplicated postoperative recovery.   PHYSICAL EXAMINATION:  VITAL SIGNS:  Weight 194 pounds.  ABDOMEN:  Soft and nontender.  Midline incision is healing well.   IMPRESSION:  Stage IIIC carcinosarcoma of the uterus with metastasis to  pelvic lymph nodes.   PLAN:  I discussed the surgical findings and pathologic findings with  the patient and her sister.  All gross disease has been resected.  We  would recommend the patient receive adjuvant therapy using a combination  of carboplatin and Taxol given every 3 weeks for 6 cycles.  The expected  side effects including alopecia, neuropathy, nausea, vomiting, fatigue,  and nadir blood counts were all discussed with the patient and her  sister.  Their questions are answered.  They are to scheduled to see Dr.  Darrold Span for consultation and treatment planning on May 12, 2008, at 10  a.m.   Total face-to-face consultation time today was 30 minutes.  The patient  will return to see me for a 6-week postoperative checkup.      De Blanch, M.D.  Electronically Signed     DC/MEDQ  D:  04/28/2008  T:  04/29/2008  Job:  [id number]   cc:   Lennis P. Darrold Span, M.D.  Fax: [phone]   W. Varney Baas, M.D.  Fax: [phone]   Telford Nab, R.N.  [address]  [place], [place] [address]   Geoffry Paradise, M.D.  Fax: [phone]

## 2010-07-05 NOTE — Consult Note (Signed)
Lori Little, Lori Little               ACCOUNT NO.:  [id number]   MEDICAL RECORD NO.:  [id number]          PATIENT TYPE:  OUT   LOCATION:  GYN                          [facility]ACILITY:  [facility]   PHYSICIAN:  De Blanch, M.D.DATE OF BIRTH:  [birth year]   DATE OF CONSULTATION:  DATE OF DISCHARGE:                                 CONSULTATION   CHIEF COMPLAINT:  Carcinosarcoma of the uterus.   HISTORY OF PRESENT ILLNESS:  A 70 year old white female seen in  consultation at request of Dr. Varney Baas regarding management of a  newly diagnosed carcinosarcoma of the uterus.  The patient first noted  vaginal spotting in December.  She was evaluated with a sonohysterogram  and ultimately underwent a D&C on March 19, 2008, which revealed a  carcinosarcoma.  The patient continues to have some spotting.  She has  minimal amount of pelvic discomfort and cramping.   PAST MEDICAL HISTORY:   MEDICAL ILLNESSES:  1. Thyroid disease.  2. Hypertension.  3. Diabetes.   CURRENT MEDICATIONS:  1. Synthroid.  2. Benicar.  3. Fortamet ER.  4. Baby aspirin.   PAST SURGICAL HISTORY:  Umbilical hernia repair in 2007, ganglion cyst  (right wrist), tonsils and adenoidectomy, and D&C.   DRUG ALLERGIES:  EX-LAX as a child caused her to have hives.   SOCIAL HISTORY:  The patient is divorced.  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  She does not smoke.  She does not drink.   FAMILY HISTORY:  Mother with lung cancer.  There is no breast, colon, or  ovarian cancer in the family.  The patient has 3 sisters who are alive  and well.   OBSTETRICAL HISTORY:  Gravida 0.  She has 1 adopted daughter.   REVIEW OF SYSTEMS:  A 10-point comprehensive review of systems is  negative except as noted above.   PHYSICAL EXAMINATION:  VITAL SIGNS:  Weight 198 pounds, height 5 feet 3  inches, and blood pressure 146/80.  GENERAL:  The patient is a pleasant, moderately obese white female in no  acute  distress.  NECK:  Supple without thyromegaly.  There is no supraclavicular or  inguinal adenopathy.  ABDOMEN:  Obese, soft, and nontender.  No masses, organomegaly, ascites,  or hernias are noted.  She has a transverse incision around her  umbilicus from her umbilical hernia repair.  PELVIC:  EG/BUS, vagina, bladder, and urethra are normal.  The cervix  appears normal.  Uterus is anterior, normal shape, size, and  consistency.  There are no adnexal masses noted.  EXTREMITIES:  Lower extremities are without edema or varicosities.   IMPRESSION:  Carcinosarcoma of the uterus.  We had a lengthy discussion  regarding the natural history of this disease and management.  We would  recommend the patient undergo a total abdominal hysterectomy, bilateral  salpingo-oophorectomy, pelvic and paraaortic lymphadenectomy, and  surgical staging in order to determine whether any adjuvant therapy will  be advisable.  The patient does understand that she might require  postoperative radiation therapy and/or chemotherapy depending upon  findings.   The  surgical procedure, risks, and benefits were discussed.  The patient  wishes to go ahead with surgery and we will coordinate this with Dr.  Jennette Kettle in the very near future.      De Blanch, M.D.  Electronically Signed     DC/MEDQ  D:  04/03/2008  T:  04/03/2008  Job:  [id number]   cc:   Freddy Finner, M.D.  Fax: [phone]   Telford Nab, R.N.  [address]  [place], [place] [address]   Geoffry Paradise, M.D.  Fax: [phone]

## 2010-07-05 NOTE — Discharge Summary (Signed)
NAMERENISE, GILLIES               ACCOUNT NO.:  [id number]   MEDICAL RECORD NO.:  [id number]          PATIENT TYPE:  INP   LOCATION:  1540                         FACILITY:  [facility]   PHYSICIAN:  Freddy Finner, M.D.   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  She  had a benign leiomyomata.  Her TNM code is pT1b, pM1, pMX.  FIGO stage  is IIIC1.   OPERATIVE PROCEDURE:  Total abdominal hysterectomy, bilateral salpingo-  oophorectomy, pelvic and periaortic lymphadenectomy, intraoperative and  postoperative.   INTRAOPERATIVE AND POSTOPERATIVE COMPLICATIONS:  None.   DISPOSITION:  The patient was in satisfactory improved condition on the  third postoperative day.  At that time she was having adequate bowel and  bladder function.  She was ambulating without assistance.  She was  tolerating a regular diet.  She was discharged home on progressively  increasing physical activity but no heavy lifting or vaginal entry.  She  is to take a regular diet.  Her medications are Vicoprofen 1 or 2 every  4 hours as needed for pain, Zofran oral dissolving tablet 8 mg for  nausea.  She is also to resume all of her preoperative medications which  are outlined in the chart.   Details of the present illness, past history, family history, review of  systems and physical exam are recorded in Dr. Nelwyn Salisbury note  which is included in the chart.  Briefly, the patient had an episode of  postmenopausal bleeding leading to hysteroscopy and D and C with  findings of mixed mullerian carcinosarcoma.  She was seen in  consultation by  Dr. Stanford Breed and is now admitted for surgery.   Physical findings on admission are remarkable for moderate obesity.  Her  other physical findings are essentially normal.  Clinically her pelvic  exam is compromised by obesity but no palpable abnormalities is noted  nor is any visible abnormality in the vaginal canal or external  genitalia except for atrophy.   Laboratory data during this admission includes a normal CBC on admission  with hemoglobin of 14.7.  Postoperative hemoglobin was 12.0.  Differential white count was normal on admission.  Chemistries were  normal on admission except for glucose elevated to 189, postoperatively  her glucose was 151.  Other parameters normal.  Blood type is A  negative.   HOSPITAL COURSE:  The patient had a bowel prep prior to her admission  and was admitted on the morning of surgery and taken to the operating  room where the above described surgical procedure was accomplished.  Her  postoperative course was remarkably smooth and uncomplicated.  She  remained afebrile throughout her postoperative stay.  Her vital signs  were normal.  She had sliding scale for glucose management.  By the  morning of  the third postoperative day she was having adequate bowel and bladder  function.  She remained afebrile.  Her incision was clean, dry and  intact.  She was given routine postoperative instructions.  She is to  follow up in approximately a week for staple removal.  Her condition was  considered to be good.      Freddy Finner, M.D.  Electronically Signed     WRN/MEDQ  D:  07/11/2008  T:  07/11/2008  Job:  [id number]

## 2010-07-05 NOTE — Consult Note (Signed)
NAMEMEARL, HAREWOOD               ACCOUNT NO.:  [id number]   MEDICAL RECORD NO.:  [id number]          PATIENT TYPE:  OUT   LOCATION:  GYN                          FACILITY:  [facility]   PHYSICIAN:  De Blanch, M.D.DATE OF BIRTH:  [birth year]   DATE OF CONSULTATION:  10/07/2008  DATE OF DISCHARGE:                                 CONSULTATION   CHIEF COMPLAINT:  Carcinosarcoma of the uterus.   INTERVAL HISTORY:  The patient returns today, now having completed six  cycles of carboplatin and Taxol, given as adjuvant for high-risk factors  associated with her carcinosarcoma.  Specifically, she had positive left  pelvic lymph nodes.  The last cycle of chemotherapy was on September 03, 2008.   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  Otherwise she has no GI or GU symptoms.  No  pelvic pain, pressure, vaginal bleeding or discharge.  She has had a  subsequent CT scan of the abdomen and pelvis on August 12, which  unfortunately shows a 4.4 x 2.9 x 3.2-cm mass on the left pelvic  sidewall.   HISTORY OF PRESENT ILLNESS:  The patient initially presented with  abnormal postmenopausal bleeding and was found to have a carcinosarcoma  on endometrial biopsy.  She underwent total abdominal hysterectomy,  bilateral salpingo-oophorectomy, pelvic and periaortic lymphadenectomy  on April 07, 2008.  Final pathology showed a carcinosarcoma involving  the entire endometrial cavity and extending into the lower uterine  segment with deep myometrial invasion (2.7 out of 2.9-cm thickness of  myometrium).  Two of 12 lymph nodes were positive (left pelvic  sidewall).   PAST MEDICAL HISTORY:  Medical illnesses:  Thyroid disease,  hypertension, diabetes, obesity.   CURRENT MEDICATIONS:  Synthroid, Benicar, Fortamet ER, baby aspirin.   PAST SURGICAL HISTORY:  Umbilical hernia  repair, 2007.  Ganglion cyst of  the right wrist.  Tonsils and adenoidectomy.  D&C, TAH-BSO, pelvic and  periaortic lymphadenectomy, 2010.   DRUG ALLERGIES:  EX-LAX caused her to have hives as a child.   SOCIAL HISTORY:  The patient is divorced.  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  She does not smoke or drink.   FAMILY HISTORY:  Mother had lung cancer.   OBSTETRICAL HISTORY:  Gravida 0.  She has one adopted child.   REVIEW OF SYSTEMS:  Ten-point comprehensive review of systems negative  except as noted above.   PHYSICAL EXAM:  VITAL SIGNS:  Weight 186 pounds, blood pressure 140/78.  GENERAL:  Patient is a healthy white female in no acute distress.  HEENT:  Negative.  NECK:  Supple without thyromegaly.  There is no supraclavicular or  inguinal adenopathy.  ABDOMEN:  Obese, soft, nontender.  No mass, organomegaly, ascites or  hernias noted.  PELVIC EXAM:  EG, BUS vagina, bladder, urethra are normal.  Cervix and  uterus surgically absent.  Adnexa without masses.  Rectovaginal exam  confirms.  I am unable to feel any mass on the pelvic sidewall.  LOWER EXTREMITIES:  Without edema or varicosities.   IMPRESSION:  Stage III-C  carcinosarcoma of the uterus, status post  surgery and six cycles of carboplatin and Taxol chemotherapy.  She has a  worrisome lesion on the left pelvic sidewall.  I have discussed this  finding with the patient and we will arrange to have a fine-needle  aspiration to confirm histology.  If she does have a recurrence on the  left pelvic sidewall, then I would recommend she receive pelvic  radiation therapy.      De Blanch, M.D.  Electronically Signed     DC/MEDQ  D:  10/07/2008  T:  10/07/2008  Job:  [id number]   cc:   Lennis P. Darrold Span, M.D.  Fax: [phone]   W. Varney Baas, M.D.  Fax: [phone]   Telford Nab, R.N.  [address]  [place], [place] [address]   Geoffry Paradise, M.D.  Fax: [phone]

## 2010-07-05 NOTE — Op Note (Signed)
Lori Little, Lori Little               ACCOUNT NO.:  [id number]   MEDICAL RECORD NO.:  [id number]          PATIENT TYPE:  INP   LOCATION:  0004                         FACILITY:  [facility]   PHYSICIAN:  De Blanch, M.D.DATE OF BIRTH:  10-08-1940   DATE OF PROCEDURE:  04/07/2008  DATE OF DISCHARGE:                               OPERATIVE REPORT   PREOPERATIVE DIAGNOSIS:  Carcinosarcoma of the uterus.   POSTOPERATIVE DIAGNOSIS:  Carcinosarcoma of the uterus with grossly  enlarged and metastatic left pelvic lymph node.   PROCEDURE:  Total abdominal surgery, bilateral salpingo-oophorectomy,  pelvic and periaortic lymphadenectomy.   SURGEON:  De Blanch, M.D.   ASSISTANT:  Freddy Finner, M.D. and Telford Nab, R.N.   ANESTHESIA:  General per orotracheal tube.   ESTIMATED BLOOD LOSS:  150 mL.   SURGICAL FINDINGS:  At the time of exploratory laparotomy the upper  abdomen including the liver, spleen, stomach, omentum, small and large  bowel appeared normal.  The appendix was retrocecal.  The uterus was  normal-size.  Tubes and ovaries appeared normal.  On frozen section the  uterus contained a deeply invasive high-grade malignancy.  The patient  had a 3 cm left pelvic lymph node which we sent for frozen section and  we were told this contained metastatic disease as well.   DESCRIPTION OF PROCEDURE:  The patient was brought to the operating room  and after satisfactory attainment of general anesthesia was placed in a  modified lithotomy position in [place] stirrups.  The anterior abdominal  wall, perineum and vagina were prepped with Betadine.  Foley catheter  was inserted and the patient was draped.  The abdomen was entered  through a low midline incision.  Ultimately to complete the aortic node  dissection, the incision was carried through the skin incision of her  umbilical hernia repair and then cephalad approximately 5 cm above the  umbilicus.   On entering  the peritoneal cavity, washings were obtained and submitted  to pathology.  The Bookwalter retractor was positioned and the bowel was  packed out of the pelvis.  The uterus was grasped with long Kelly  clamps.  The round ligaments were divided.  The retroperitoneal space  was opened identifying the pararectal and paravesical spaces.  With the  ureter under direct visualization the ovarian vessels were skeletonized,  clamped, cut, free tied and suture ligated.  The bladder flap was  incised and advanced with sharp and blunt dissection.  The uterine  vessels were skeletonized then clamped, cut and suture ligated.  In a  stepwise fashion the paracervical and cardinal ligaments were clamped,  cut and suture ligated.  In order to gain better mobilization, the  rectovaginal septum was developed.  The uterosacral ligaments and  vaginal angles were then cross clamped, divided and the vagina  transected from its connection to the cervix.  The uterus, cervix, tubes  and ovaries were submitted to pathology for frozen section.  Vaginal  angles were transfixed with 0 Vicryl and the central portion of the  vagina closed with interrupted figure-of-eight sutures of 0 Vicryl.  Attention was turned to performing the pelvic lymphadenectomy.  Lymph  nodes from the external iliac artery and vein, hypogastric artery and  obturator fossa were removed.  Care was taken to avoid injury to the  hypogastric vein and the obturator nerve.  A similar procedure was  performed on both sides of the pelvis.  The pelvis was then inspected  and found to be hemostatic.   Attention was then turned to the periaortic node dissection.  The  abdominal incision was extended in order to get exposure of the aorta.  The small bowel was packed to the right and an incision made overlying  the right common iliac artery and along the aorta.  The right ureter was  mobilized laterally and protected behind a malleable retractor.   Dissection carried underneath the duodenum which was also retracted  cephalad.  The lymph nodes were then removed from the aorta and vena  cava.  Hemostasis again created by cautery and hemoclips.  This surgical  field was found to be hemostatic.   The pelvis was reinspected and found to be hemostatic.  The abdomen and  pelvis were irrigated with saline and then all retractors and packs were  removed.  The anterior abdominal wall was closed with a running mass  closure using #1 PDS.  Subcutaneous tissue was irrigated.  Hemostasis  achieved with cautery.  Subcutaneous tissue was reapproximated with 3-0  Vicryl and then skin staples were applied.  A dressing was applied and  the patient was awakened from anesthesia and taken to the recovery room  in satisfactory addition.  Sponge, needle, and instrument counts were  correct x2.      De Blanch, M.D.  Electronically Signed     DC/MEDQ  D:  04/07/2008  T:  04/07/2008  Job:  [id number]   cc:   Freddy Finner, M.D.  Fax: [phone]   Telford Nab, R.N.  [address]  [place], [place] [address]   Geoffry Paradise

## 2010-07-08 NOTE — Op Note (Signed)
NAMEMURL, ZOGG               ACCOUNT NO.:  [id number]   MEDICAL RECORD NO.:  [id number]          PATIENT TYPE:  AMB   LOCATION:  DAY                          FACILITY:  [facility]   PHYSICIAN:  Angelia Mould. Derrell Lolling, M.D.DATE OF BIRTH:  1940-07-22   DATE OF PROCEDURE:  12/16/2003  DATE OF DISCHARGE:                                 OPERATIVE REPORT   PREOPERATIVE DIAGNOSIS:  Umbilical hernia.   POSTOPERATIVE DIAGNOSIS:  Umbilical hernia.   OPERATION PERFORMED:  Repair of umbilical hernia.   SURGEON:  Angelia Mould. Derrell Lolling, M.D.   OPERATIVE INDICATIONS:  This is a 71 year old white female who has noticed a  bulge in her umbilicus for about six to eight months.  She is really not  having many symptoms but is concerned about the bulge and concerned about  risk of incarceration and strangulation.  On exam, she is somewhat obese and  has a nontender umbilical hernia that is partially reducible, with the  defect about 2 cm in size.  She is brought to the operating room electively.   OPERATIVE TECHNIQUE:  Following the induction of general endotracheal  anesthesia, the patient's abdomen was prepped and draped in a sterile  fashion.  Intravenous Ancef was given preoperatively.  One percent Xylocaine  with epinephrine was used as a local infiltration anesthetic.  A curved  transverse incision was made just below the lower rim of the umbilicus.  Dissection was carried down through the subcutaneous tissue.  The fascia  around the umbilical skin was cleaned off, and the umbilical skin dissected  off away from the umbilical hernia sac and defect.  We cleaned off the  fascia all around the defect.  Some incarcerated omentum was dissected away  from the edge of the fascia and reduced into the preperitoneal and  peritoneal space.  The muscle and fascia around the defect appeared quite  strong and full-thickness, and we chose to repair this primarily.  The  hernia repair was performed in a  vest-over-pants fashion using four  interrupted sutures of #1 Novofil.  The sutures were individually placed,  and then the edges of the fascia lifted up and the sutures held tight, and  the overlap looked good.  We then tied all four sutures.  This provided a  very secure overlapping repair.  The wound was irrigated with saline.  Hemostasis was excellent.  The umbilical skin was tacked back to the fascia  with 3-0 Vicryl suture.  The subcutaneous tissue was closed with interrupted  sutures of 3-0 Vicryl.  The skin was closed with a running subcuticular  suture of 4-0 Monocryl and Dermabond.  The patient tolerated the procedure  well and was taken to the recovery room in stable condition.   ESTIMATED BLOOD LOSS:  About 5 cc.   COMPLICATIONS:  None.   SPONGE, NEEDLE, AND INSTRUMENT COUNTS:  Correct.     Hayw   HMI/MEDQ  D:  12/16/2003  T:  12/16/2003  Job:  [id number]   cc:   Geoffry Paradise, M.D.  [address]  [place]  [place] [address]  Fax: [phone]

## 2010-11-18 ENCOUNTER — Other Ambulatory Visit: Payer: Self-pay | Admitting: Gynecology

## 2010-11-18 ENCOUNTER — Other Ambulatory Visit (HOSPITAL_COMMUNITY)
Admission: RE | Admit: 2010-11-18 | Discharge: 2010-11-18 | Disposition: A | Discharge status: Home / Self Care | Payer: Medicare Other | Source: Ambulatory Visit | Attending: Gynecology | Admitting: Gynecology

## 2010-11-18 ENCOUNTER — Ambulatory Visit: Payer: Medicare Other | Attending: Gynecology | Admitting: Gynecology

## 2010-11-18 DIAGNOSIS — Z801 Family history of malignant neoplasm of trachea, bronchus and lung: Secondary | ICD-10-CM | POA: Insufficient documentation

## 2010-11-18 DIAGNOSIS — E119 Type 2 diabetes mellitus without complications: Secondary | ICD-10-CM | POA: Insufficient documentation

## 2010-11-18 DIAGNOSIS — Z79899 Other long term (current) drug therapy: Secondary | ICD-10-CM | POA: Insufficient documentation

## 2010-11-18 DIAGNOSIS — Z8540 Personal history of malignant neoplasm of unspecified female genital organ: Secondary | ICD-10-CM | POA: Insufficient documentation

## 2010-11-18 DIAGNOSIS — Z90710 Acquired absence of both cervix and uterus: Secondary | ICD-10-CM | POA: Insufficient documentation

## 2010-11-18 DIAGNOSIS — C801 Malignant (primary) neoplasm, unspecified: Secondary | ICD-10-CM

## 2010-11-18 DIAGNOSIS — C55 Malignant neoplasm of uterus, part unspecified: Secondary | ICD-10-CM | POA: Insufficient documentation

## 2010-11-18 DIAGNOSIS — I10 Essential (primary) hypertension: Secondary | ICD-10-CM | POA: Insufficient documentation

## 2010-11-18 DIAGNOSIS — Z9079 Acquired absence of other genital organ(s): Secondary | ICD-10-CM | POA: Insufficient documentation

## 2010-11-18 DIAGNOSIS — E669 Obesity, unspecified: Secondary | ICD-10-CM | POA: Insufficient documentation

## 2010-11-18 DIAGNOSIS — R918 Other nonspecific abnormal finding of lung field: Secondary | ICD-10-CM | POA: Insufficient documentation

## 2010-11-18 DIAGNOSIS — E039 Hypothyroidism, unspecified: Secondary | ICD-10-CM | POA: Insufficient documentation

## 2010-11-18 LAB — CREATININE, SERUM
Creatinine, Ser: 0.7 mg/dL (ref 0.50–1.10)
GFR calc Af Amer: >60 mL/min (ref >60)
GFR calc non Af Amer: >60 mL/min (ref >60)

## 2010-11-18 LAB — BUN: BUN: 20 mg/dL (ref 6–23)

## 2010-11-23 DIAGNOSIS — H269 Unspecified cataract: Secondary | ICD-10-CM | POA: Insufficient documentation

## 2010-11-23 NOTE — Consult Note (Signed)
  Lori Little, Lori Little               ACCOUNT NO.:  [id number]  MEDICAL RECORD NO.:  [id number]  LOCATION:  GYN                          FACILITY:  [facility]  PHYSICIAN:  De Blanch, M.D.DATE OF BIRTH:  Dec 21, 1940  DATE OF CONSULTATION:  11/18/2010 DATE OF DISCHARGE:                                CONSULTATION   CHIEF COMPLAINT:  Carcinosarcoma of the uterus.  INTERVAL HISTORY:  The patient returns today for continuing followup. Since her last visit, she has done well.  She denies any GI or GU symptoms; has no pelvic pain, pressure, vaginal bleeding, or discharge. Functional status is excellent.  It is noted that she had a CT scan in March 2012 for surveillance.  Some small pulmonary nodules were identified.  Otherwise, her scan was normal.  No evidence of disease.  In July, the patient had cataract surgery which was apparently successful.  HISTORY OF PRESENT ILLNESS:  Carcinosarcoma of the uterus, initially undergoing TAH-BSO, pelvic and periaortic lymphadenectomy in February 2010.  Final pathology showed tumor, involving the entire endometrial cavity and lower uterine segment with deep myometrial invasion.  She had 2/12 lymph nodes positive on the left pelvic sidewall.  Postoperatively, she received 6 cycles of carboplatin and Taxol chemotherapy, completed in July 2010.  PAST MEDICAL HISTORY/MEDICAL ILLNESSES: 1. Obesity. 2. Diabetes. 3. Hypertension. 4. Plantar fasciitis. 5. Hypothyroidism.  CURRENT MEDICATIONS:  Synthroid, Benicar, Fortamet ER, and baby aspirin.  PAST SURGICAL HISTORY:  Umbilical hernia repair, ganglion cyst of the right wrist, tonsil and adenoidectomy, TAH-BSO, pelvic and periaortic lymphadenectomy.  DRUG ALLERGIES:  EX-LAX (hives).  SOCIAL HISTORY:  The patient is divorced.  She is a retired Runner, broadcasting/film/video. Does not smoke.  FAMILY HISTORY:  Mother had lung cancer.  OBSTETRICAL HISTORY:  Gravida 0.  REVIEW OF SYSTEMS:  Ten point comprehensive  review of systems negative except as noted above.  PHYSICAL EXAMINATION:  VITAL SIGNS:  Weight 199 pounds, blood pressure 130/74. GENERAL:  The patient is a pleasant, healthy, white female, in no acute distress. HEENT:  Negative. NECK:  Supple without thyromegaly.  There is no supraclavicular or inguinal adenopathy. ABDOMEN:  Obese, soft, nontender.  No mass, organomegaly, ascites, or hernias noted. PELVIC:  EGBUS, vagina, bladder, urethra are normal.  Cervix and uterus are surgically absent.  Adnexa without masses.  Rectovaginal exam confirms.  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  Pap smears were obtained.  We will have the patient repeat a CT scan in the next week or so for reevaluation in particular of the pulmonary nodules, but we will go ahead and complete a CT of the abdomen, pelvis, as well as chest.  The patient will return to see me in 6 months for continuing followup.     De Blanch, M.D.     DC/MEDQ  D:  11/18/2010  T:  11/18/2010  Job:  [id number]  cc:   Freddy Finner, M.D. Fax: [phone]  Reece Packer, M.D. Fax: [phone]  Geoffry Paradise, M.D. Fax: [phone]  Electronically Signed by De Blanch M.D. on 11/23/2010 10:58:28 AM

## 2010-11-24 ENCOUNTER — Ambulatory Visit (HOSPITAL_COMMUNITY)
Admission: RE | Admit: 2010-11-24 | Discharge: 2010-11-24 | Disposition: A | Discharge status: Home / Self Care | Payer: Medicare Other | Source: Ambulatory Visit | Attending: Gynecology | Admitting: Gynecology

## 2010-11-24 DIAGNOSIS — J984 Other disorders of lung: Secondary | ICD-10-CM | POA: Insufficient documentation

## 2010-11-24 DIAGNOSIS — C549 Malignant neoplasm of corpus uteri, unspecified: Secondary | ICD-10-CM | POA: Insufficient documentation

## 2010-11-24 DIAGNOSIS — C801 Malignant (primary) neoplasm, unspecified: Secondary | ICD-10-CM

## 2010-11-24 DIAGNOSIS — M47814 Spondylosis without myelopathy or radiculopathy, thoracic region: Secondary | ICD-10-CM | POA: Insufficient documentation

## 2010-11-24 MED ORDER — IOHEXOL 300 MG/ML  SOLN
100.0000 mL | Freq: Once | INTRAMUSCULAR | Status: AC | PRN
  Administered 2010-11-24: 100 mL via INTRAVENOUS

## 2010-11-25 ENCOUNTER — Ambulatory Visit: Payer: Medicare Other | Admitting: Gynecology

## 2011-05-11 ENCOUNTER — Encounter: Payer: Self-pay | Admitting: Gynecologic Oncology

## 2011-05-12 ENCOUNTER — Encounter: Payer: Self-pay | Admitting: Gynecology

## 2011-05-12 ENCOUNTER — Ambulatory Visit: Payer: Medicare Other | Attending: Gynecology | Admitting: Gynecology

## 2011-05-12 VITALS — BP 138/70 | HR 66 | Temp 98.5°F | Resp 16 | Ht 63.23 in | Wt 205.9 lb

## 2011-05-12 DIAGNOSIS — Z9079 Acquired absence of other genital organ(s): Secondary | ICD-10-CM | POA: Diagnosis not present

## 2011-05-12 DIAGNOSIS — C55 Malignant neoplasm of uterus, part unspecified: Secondary | ICD-10-CM | POA: Diagnosis not present

## 2011-05-12 DIAGNOSIS — E039 Hypothyroidism, unspecified: Secondary | ICD-10-CM | POA: Insufficient documentation

## 2011-05-12 DIAGNOSIS — Z79899 Other long term (current) drug therapy: Secondary | ICD-10-CM | POA: Insufficient documentation

## 2011-05-12 DIAGNOSIS — Z87891 Personal history of nicotine dependence: Secondary | ICD-10-CM | POA: Insufficient documentation

## 2011-05-12 DIAGNOSIS — I10 Essential (primary) hypertension: Secondary | ICD-10-CM | POA: Diagnosis not present

## 2011-05-12 DIAGNOSIS — C549 Malignant neoplasm of corpus uteri, unspecified: Secondary | ICD-10-CM | POA: Diagnosis not present

## 2011-05-12 DIAGNOSIS — Z90710 Acquired absence of both cervix and uterus: Secondary | ICD-10-CM | POA: Diagnosis not present

## 2011-05-12 DIAGNOSIS — E119 Type 2 diabetes mellitus without complications: Secondary | ICD-10-CM | POA: Diagnosis not present

## 2011-05-12 DIAGNOSIS — I8390 Asymptomatic varicose veins of unspecified lower extremity: Secondary | ICD-10-CM | POA: Insufficient documentation

## 2011-05-12 DIAGNOSIS — Z9221 Personal history of antineoplastic chemotherapy: Secondary | ICD-10-CM | POA: Insufficient documentation

## 2011-05-12 DIAGNOSIS — Z801 Family history of malignant neoplasm of trachea, bronchus and lung: Secondary | ICD-10-CM | POA: Diagnosis not present

## 2011-05-12 DIAGNOSIS — Z7982 Long term (current) use of aspirin: Secondary | ICD-10-CM | POA: Insufficient documentation

## 2011-05-12 NOTE — Patient Instructions (Signed)
Return for follow-up in 6 months.

## 2011-05-12 NOTE — Progress Notes (Signed)
Consult Note: Gyn-Onc   Lori Little 71 y.o. female  Chief Complaint  Patient presents with  . Uterine ca    Follow up    Interval History:  Patient returns today as previously scheduled. Since her last visit she's had no problems she denies any GI or GU symptoms has no pelvic pain pressure vaginal bleeding or discharge. Her functional status is excellent. She scheduled to have annual mammograms in May.    HPI: In February 2010 the patient underwent a total abdominal hysterectomy bilateral salpingo-oophorectomy and pelvic and para-aortic lymphadenectomy for a stage 1C1 carcinosarcoma of the uterus. Tumor invaded the entire endometrial cavity lower uterine segment with deep myometrial invasion. In addition she had 2 of 12 positive pelvic lymph nodes. She received 6 cycles of carboplatin and Taxol postoperatively completed in July of 2010. Allergies  Allergen Reactions  . Sennosides Hives    ExCordelia Poche    Past Medical History  Diagnosis Date  . Uterine cancer     uterine ca dx 2010  . Diabetes mellitus   . Hypertension   . Obesity   . Hypothyroidism   . Plantar fasciitis     Past Surgical History  Procedure Date  . Umbilical hernia repair   . Ganglion cyst excision     Right wrist  . Tonsillectomy     with adenoidectomy  . Abdominal hysterectomy     TAH with BSO, pelvic & periaortic lymphadenectomy  . Cataract extraction     Current Outpatient Prescriptions  Medication Sig Dispense Refill  . alendronate (FOSAMAX) 70 MG tablet Take 70 mg by mouth every 7 (seven) days. Take with a full glass of water on an empty stomach.      Marland Kitchen aspirin 81 MG tablet Take 81 mg by mouth daily.      . Calcium Carbonate-Vit D-Min (CALCIUM 1200 PO) Take 1 tablet by mouth 2 (two) times daily.      . cholecalciferol (VITAMIN D) 1000 UNITS tablet Take 1,000 Units by mouth daily.      . Cholecalciferol (VITAMIN D) 400 UNITS capsule Take 400 Units by mouth 2 (two) times daily before a meal. With  ca      . Coenzyme Q10 (CO Q 10) 100 MG CAPS Take 1 tablet by mouth daily.      Marland Kitchen docusate sodium (COLACE) 100 MG capsule Take 100 mg by mouth 2 (two) times daily as needed.      . fish oil-omega-3 fatty acids 1000 MG capsule Take 1 g by mouth daily.      . Glucosamine-Chondroitin (COSAMIN DS PO) Take 1 tablet by mouth daily. 1500 Glucosamine & 1200 Chondroitin      . GRAPE SEED EXTRACT PO Take 2 tablets by mouth daily.      . Levothyroxine Sodium (SYNTHROID PO) Take 112 mcg by mouth daily.       . metformin (FORTAMET) 500 MG (OSM) 24 hr tablet Take 500 mg by mouth 2 (two) times daily with a meal. 2 to 3 times a day      . Multiple Vitamins-Minerals (MULTIVITAMIN PO) Take 2 tablets by mouth daily.      Marland Kitchen olmesartan-hydrochlorothiazide (BENICAR HCT) 40-12.5 MG per tablet Take 1 tablet by mouth daily.      Marland Kitchen omeprazole (PRILOSEC) 20 MG capsule Take 20 mg by mouth daily.      . Red Yeast Rice 600 MG TABS Take 2 tablets by mouth daily.      . vitamin B-12 (  CYANOCOBALAMIN) 1000 MCG tablet Take 1,000 mcg by mouth daily.        History   Social History  . Marital Status: Divorced    Spouse Name: N/A    Number of Children: N/A  . Years of Education: N/A   Occupational History  . Not on file.   Social History Main Topics  . Smoking status: Former Smoker -- 6 years    Quit date: 02/21/1976  . Smokeless tobacco: Not on file  . Alcohol Use: Not on file  . Drug Use: Not on file  . Sexually Active: Not on file   Other Topics Concern  . Not on file   Social History Narrative  . No narrative on file    Family History  Problem Relation Age of Onset  . Lung cancer Mother     Review of Systems: 10 point review of systems is negative except as noted above  Vitals: Blood pressure 138/70, pulse 66, temperature 98.5 F (36.9 C), temperature source Oral, resp. rate 16, height 5' 3.23" (1.606 m), weight 205 lb 14.4 oz (93.396 kg).  Physical Exam: In general the patient is a pleasant healthy  white female no acute distress HEENT is negative  Neck is supple without thyromegaly  There is no subclavicular or inguinal adenopathy  The abdomen is obese soft nontender no masses again a megaly ascites or hernias are noted  Pelvic exam  EGBUS vagina bladder urethra are normal.  Cervix and uterus are surgically absent  Bimanual and rectovaginal exam revealed no adnexal masses.  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    Assessment/Plan: Carcinosarcoma of the uterus status post surgical resection and adjuvant carboplatin and Taxol chemotherapy completed in July 2010. The patient's clinically free of disease  She returned to see me in 6 months or continuing followup   Jeannette Corpus, MD 05/12/2011, 2:15 PM                         Consult Note: Gyn-Onc   Lori Little 71 y.o. female  Chief Complaint  Patient presents with  . Uterine ca    Follow up    Interval History:   HPI:  Allergies  Allergen Reactions  . Sennosides Hives    ExCordelia Poche    Past Medical History  Diagnosis Date  . Uterine cancer     uterine ca dx 2010  . Diabetes mellitus   . Hypertension   . Obesity   . Hypothyroidism   . Plantar fasciitis     Past Surgical History  Procedure Date  . Umbilical hernia repair   . Ganglion cyst excision     Right wrist  . Tonsillectomy     with adenoidectomy  . Abdominal hysterectomy     TAH with BSO, pelvic & periaortic lymphadenectomy  . Cataract extraction     Current Outpatient Prescriptions  Medication Sig Dispense Refill  . alendronate (FOSAMAX) 70 MG tablet Take 70 mg by mouth every 7 (seven) days. Take with a full glass of water on an empty stomach.      Marland Kitchen aspirin 81 MG tablet Take 81 mg by mouth daily.      . Calcium Carbonate-Vit D-Min (CALCIUM 1200 PO) Take 1 tablet by mouth 2 (two) times daily.      . cholecalciferol (VITAMIN D) 1000 UNITS tablet Take 1,000 Units by mouth daily.       . Cholecalciferol (VITAMIN D) 400 UNITS capsule  Take 400 Units by mouth 2 (two) times daily before a meal. With ca      . Coenzyme Q10 (CO Q 10) 100 MG CAPS Take 1 tablet by mouth daily.      Marland Kitchen docusate sodium (COLACE) 100 MG capsule Take 100 mg by mouth 2 (two) times daily as needed.      . fish oil-omega-3 fatty acids 1000 MG capsule Take 1 g by mouth daily.      . Glucosamine-Chondroitin (COSAMIN DS PO) Take 1 tablet by mouth daily. 1500 Glucosamine & 1200 Chondroitin      . GRAPE SEED EXTRACT PO Take 2 tablets by mouth daily.      . Levothyroxine Sodium (SYNTHROID PO) Take 112 mcg by mouth daily.       . metformin (FORTAMET) 500 MG (OSM) 24 hr tablet Take 500 mg by mouth 2 (two) times daily with a meal. 2 to 3 times a day      . Multiple Vitamins-Minerals (MULTIVITAMIN PO) Take 2 tablets by mouth daily.      Marland Kitchen olmesartan-hydrochlorothiazide (BENICAR HCT) 40-12.5 MG per tablet Take 1 tablet by mouth daily.      Marland Kitchen omeprazole (PRILOSEC) 20 MG capsule Take 20 mg by mouth daily.      . Red Yeast Rice 600 MG TABS Take 2 tablets by mouth daily.      . vitamin B-12 (CYANOCOBALAMIN) 1000 MCG tablet Take 1,000 mcg by mouth daily.        History   Social History  . Marital Status: Divorced    Spouse Name: N/A    Number of Children: N/A  . Years of Education: N/A   Occupational History  . Not on file.   Social History Main Topics  . Smoking status: Former Smoker -- 6 years    Quit date: 02/21/1976  . Smokeless tobacco: Not on file  . Alcohol Use: Not on file  . Drug Use: Not on file  . Sexually Active: Not on file   Other Topics Concern  . Not on file   Social History Narrative  . No narrative on file    Family History  Problem Relation Age of Onset  . Lung cancer Mother     Review of Systems:  Vitals: Blood pressure 138/70, pulse 66, temperature 98.5 F (36.9 C), temperature source Oral, resp. rate 16, height 5' 3.23" (1.606 m), weight 205 lb 14.4 oz (93.396  kg).  Physical Exam:  Assessment/Plan:   Jeannette Corpus, MD 05/12/2011, 2:15 PM

## 2011-06-21 DIAGNOSIS — Z961 Presence of intraocular lens: Secondary | ICD-10-CM | POA: Insufficient documentation

## 2011-06-21 DIAGNOSIS — E119 Type 2 diabetes mellitus without complications: Secondary | ICD-10-CM | POA: Diagnosis not present

## 2011-06-29 DIAGNOSIS — Z1231 Encounter for screening mammogram for malignant neoplasm of breast: Secondary | ICD-10-CM | POA: Diagnosis not present

## 2011-09-12 DIAGNOSIS — M810 Age-related osteoporosis without current pathological fracture: Secondary | ICD-10-CM | POA: Diagnosis not present

## 2011-09-12 DIAGNOSIS — R82998 Other abnormal findings in urine: Secondary | ICD-10-CM | POA: Diagnosis not present

## 2011-09-12 DIAGNOSIS — E785 Hyperlipidemia, unspecified: Secondary | ICD-10-CM | POA: Diagnosis not present

## 2011-09-12 DIAGNOSIS — E119 Type 2 diabetes mellitus without complications: Secondary | ICD-10-CM | POA: Diagnosis not present

## 2011-09-18 DIAGNOSIS — I10 Essential (primary) hypertension: Secondary | ICD-10-CM | POA: Diagnosis not present

## 2011-09-18 DIAGNOSIS — E1149 Type 2 diabetes mellitus with other diabetic neurological complication: Secondary | ICD-10-CM | POA: Diagnosis not present

## 2011-09-18 DIAGNOSIS — E785 Hyperlipidemia, unspecified: Secondary | ICD-10-CM | POA: Diagnosis not present

## 2011-09-18 DIAGNOSIS — Z0000 Encounter for general adult medical examination without abnormal findings: Secondary | ICD-10-CM | POA: Diagnosis not present

## 2011-10-13 DIAGNOSIS — I10 Essential (primary) hypertension: Secondary | ICD-10-CM | POA: Diagnosis not present

## 2011-10-13 DIAGNOSIS — I83893 Varicose veins of bilateral lower extremities with other complications: Secondary | ICD-10-CM | POA: Diagnosis not present

## 2011-10-13 DIAGNOSIS — T148XXA Other injury of unspecified body region, initial encounter: Secondary | ICD-10-CM | POA: Diagnosis not present

## 2011-11-13 ENCOUNTER — Encounter: Payer: Self-pay | Admitting: Gynecology

## 2011-11-13 ENCOUNTER — Ambulatory Visit: Payer: Medicare Other | Attending: Gynecology | Admitting: Gynecology

## 2011-11-13 ENCOUNTER — Other Ambulatory Visit (HOSPITAL_COMMUNITY)
Admission: RE | Admit: 2011-11-13 | Discharge: 2011-11-13 | Disposition: A | Discharge status: Home / Self Care | Payer: Medicare Other | Source: Ambulatory Visit | Attending: Gynecology | Admitting: Gynecology

## 2011-11-13 VITALS — BP 120/70 | HR 80 | Temp 98.6°F | Resp 18 | Ht 63.23 in | Wt 196.0 lb

## 2011-11-13 DIAGNOSIS — I10 Essential (primary) hypertension: Secondary | ICD-10-CM | POA: Diagnosis not present

## 2011-11-13 DIAGNOSIS — E039 Hypothyroidism, unspecified: Secondary | ICD-10-CM | POA: Insufficient documentation

## 2011-11-13 DIAGNOSIS — Z9079 Acquired absence of other genital organ(s): Secondary | ICD-10-CM | POA: Insufficient documentation

## 2011-11-13 DIAGNOSIS — Z124 Encounter for screening for malignant neoplasm of cervix: Secondary | ICD-10-CM | POA: Insufficient documentation

## 2011-11-13 DIAGNOSIS — C55 Malignant neoplasm of uterus, part unspecified: Secondary | ICD-10-CM | POA: Diagnosis not present

## 2011-11-13 DIAGNOSIS — C549 Malignant neoplasm of corpus uteri, unspecified: Secondary | ICD-10-CM | POA: Diagnosis not present

## 2011-11-13 DIAGNOSIS — Z7982 Long term (current) use of aspirin: Secondary | ICD-10-CM | POA: Insufficient documentation

## 2011-11-13 DIAGNOSIS — E669 Obesity, unspecified: Secondary | ICD-10-CM | POA: Diagnosis not present

## 2011-11-13 DIAGNOSIS — E119 Type 2 diabetes mellitus without complications: Secondary | ICD-10-CM | POA: Insufficient documentation

## 2011-11-13 DIAGNOSIS — Z79899 Other long term (current) drug therapy: Secondary | ICD-10-CM | POA: Insufficient documentation

## 2011-11-13 DIAGNOSIS — Z9221 Personal history of antineoplastic chemotherapy: Secondary | ICD-10-CM | POA: Diagnosis not present

## 2011-11-13 DIAGNOSIS — Z801 Family history of malignant neoplasm of trachea, bronchus and lung: Secondary | ICD-10-CM | POA: Insufficient documentation

## 2011-11-13 DIAGNOSIS — Z90710 Acquired absence of both cervix and uterus: Secondary | ICD-10-CM | POA: Diagnosis not present

## 2011-11-13 NOTE — Progress Notes (Signed)
Follow Up Note: Gyn-Onc  Lori Little 71 y.o. female  CC:  Chief Complaint  Patient presents with  . Uterine Cancer    Follow up   HPI:  Lori Little is a 71 year old female who initially presented in late December 2009 to Dr. Donnetta Hail office with vaginal spotting.  An ultrasound was performed along with a repeat sonohystogram at a return visit in January 2010.  She presented to GYN Oncology in February 2010 as a new patient.  She underwent a total abdominal hysterectomy, bilateral salpingo-oophorectomy, and pelvic/ para-aortic lymphadenectomy in February 2010.  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  There was deep myometrial invasion with the tumor invading the entire endometrial cavity and lower uterine segment.  Furthermore, 2 out of 12 pelvic lymph nodes were positive.  Post-operatively, she received 6 cycles of carboplatin and taxol, which was completed in July 2010.  She has remained clinically free of disease since.       Interval History:  Patient returns for follow up appointment as scheduled.  No complaints voiced since last visit.  Denies GI or GU symptoms.  Mammogram in May 2013 with normal findings.  Reporting last colonoscopy two to three years ago with Dr. Jacky Kindle managing her preventative care needs.  Review of Systems Constitutional: Feels well. Able to complete activities of daily living without difficulty.  Skin: No rash, sores, jaundice, itching, dryness  Cardiovascular: No chest pain, shortness of breath, or edema  Pulmonary: No cough or wheeze.  Gastro Intestinal: No nausea, vomiting, constipation, or diarrhea reported. No bright red blood per rectum, no abdominal pain, or change in bowel movement.  Genitourinary: No frequency, urgency, or dysuria  Musculoskeletal: No myalgia, arthralgia, joint swelling or pain Neurologic: No weakness, numbness, or change in gait  Psychology: No depression, anxiety, or insomnia.   Current Meds:    Outpatient Encounter Prescriptions as of 11/13/2011  Medication Sig Dispense Refill  . alendronate (FOSAMAX) 70 MG tablet Take 70 mg by mouth every 7 (seven) days. Take with a full glass of water on an empty stomach.       Marland Kitchen aspirin 81 MG tablet Take 81 mg by mouth daily.      . Calcium Carbonate-Vit D-Min (CALCIUM 1200 PO) Take 1 tablet by mouth 2 (two) times daily.      . cholecalciferol (VITAMIN D) 1000 UNITS tablet Take 1,000 Units by mouth daily.      . Coenzyme Q10 (CO Q 10) 100 MG CAPS Take 1 tablet by mouth daily.      Marland Kitchen docusate sodium (COLACE) 100 MG capsule Take 100 mg by mouth 2 (two) times daily as needed.      . fish oil-omega-3 fatty acids 1000 MG capsule Take 1 g by mouth daily.      . Glucosamine-Chondroitin (COSAMIN DS PO) Take 1 tablet by mouth daily. 1500 Glucosamine & 1200 Chondroitin       . GRAPE SEED EXTRACT PO Take 2 tablets by mouth daily.      . Levothyroxine Sodium (SYNTHROID PO) Take 112 mcg by mouth daily.       . metformin (FORTAMET) 500 MG (OSM) 24 hr tablet Take 500 mg by mouth 3 (three) times daily with meals. 3 to 4 times a day      . Multiple Vitamins-Minerals (MULTIVITAMIN PO) Take 2 tablets by mouth daily.      Marland Kitchen olmesartan-hydrochlorothiazide (BENICAR HCT) 40-12.5 MG per tablet Take 1 tablet by mouth daily.      Marland Kitchen  omeprazole (PRILOSEC) 20 MG capsule Take 20 mg by mouth daily.      . Red Yeast Rice 600 MG TABS Take 2 tablets by mouth daily.      . vitamin B-12 (CYANOCOBALAMIN) 1000 MCG tablet Take 1,000 mcg by mouth daily.      . Cholecalciferol (VITAMIN D) 400 UNITS capsule Take 400 Units by mouth 2 (two) times daily before a meal. With ca       Allergy:  Allergies  Allergen Reactions  . Sennosides Hives    Ex- Lax   Social Hx:   History   Social History  . Marital Status: Divorced    Spouse Name: N/A    Number of Children: N/A  . Years of Education: N/A   Occupational History  . Not on file.   Social History Main Topics  . Smoking  status: Former Smoker -- 6 years    Quit date: 02/21/1976  . Smokeless tobacco: Not on file  . Alcohol Use: Not on file  . Drug Use: Not on file  . Sexually Active: Not on file   Other Topics Concern  . Not on file   Social History Narrative  . No narrative on file   Past Surgical Hx:  Past Surgical History  Procedure Date  . Umbilical hernia repair   . Ganglion cyst excision     Right wrist  . Tonsillectomy     with adenoidectomy  . Abdominal hysterectomy     TAH with BSO, pelvic & periaortic lymphadenectomy  . Cataract extraction    Past Medical Hx:  Past Medical History  Diagnosis Date  . Uterine cancer     uterine ca dx 2010  . Diabetes mellitus   . Hypertension   . Obesity   . Hypothyroidism   . Plantar fasciitis    Family Hx:  Family History  Problem Relation Age of Onset  . Lung cancer Mother     Vitals:  Blood pressure 120/70, pulse 80, temperature 98.6 F (37 C), resp. rate 18, height 5' 3.23" (1.606 m), weight 196 lb (88.905 kg).  Physical Exam:  General: Well developed, well nourished female in no acute distress. Alert, oriented x3.  Cardiovascular: Heart rate and rhythm regular. S1 and S2 normal. No murmurs, clicks, or rubs noted.  Lungs: Clear to auscultation bilaterally. No wheezes, crackles, or rhonchi.  Skin: No rash, lesions, or skin breakdown noted Lymph:  No subclavicular or inguinal adenopathy noted  Abdomen: Normoactive bowel sounds, abdomen soft, non-tender and obese.  Pelvic: Vulva: Normal external female genitalia. No lesions.  EGBUS: Normal, No lesions or masses.  Vagina: No visible lesions or palpable vaginal/ pelvic masses. No discharge or bleeding noted.  Cervix and uterus surgically absent.  Rectal: Good rectal tone. No masses. No cul de sac nodularity.  Extremities: Moderate varicosities noted bilaterally with no edema.  No cyanosis.  Assessment/Plan:  71 year old female with Stage IIIC1 carcinosarcoma of the uterus.  Status  post surgical resection in February 2010 and adjuvant chemotherapy including carboplatin and taxol, which was completed in July of 2010.  She is clinically free of disease with plans to follow up with GYN Oncology in 6 months for continued follow up.  Plan to contact patient with results of pap smear performed today.  Dr. Stanford Breed present during the examination of the patient.   Josslynn Mentzer DEAL, NP 11/13/2011, 10:57 AM

## 2011-11-13 NOTE — Patient Instructions (Addendum)
We will contact you with your pap smear results.  Follow up with Gynecologic Oncology in 6 months.    Thank you for coming to see [place] today.  We appreciate your confidence in choosing [facility] for your medical care.  If you have any questions about your visit today, please call our office and we will get back to you as soon as possible.  Dr. De Blanch and Warner Mccreedy, NP Gynecologic Oncology

## 2011-11-14 MED ORDER — HYDROMORPHONE HCL PF 1 MG/ML IJ SOLN
INTRAMUSCULAR | Status: AC
  Filled 2011-11-14: qty 1

## 2011-11-17 ENCOUNTER — Telehealth: Payer: Self-pay | Admitting: Gynecologic Oncology

## 2011-11-17 NOTE — Telephone Encounter (Signed)
Pt notified about pap results: negative.  No questions or concerns voiced. 

## 2011-11-28 DIAGNOSIS — Z1212 Encounter for screening for malignant neoplasm of rectum: Secondary | ICD-10-CM | POA: Diagnosis not present

## 2011-12-04 ENCOUNTER — Telehealth: Payer: Self-pay | Admitting: Gastroenterology

## 2011-12-04 NOTE — Telephone Encounter (Signed)
error 

## 2011-12-05 DIAGNOSIS — Z23 Encounter for immunization: Secondary | ICD-10-CM | POA: Diagnosis not present

## 2012-01-22 DIAGNOSIS — H18839 Recurrent erosion of cornea, unspecified eye: Secondary | ICD-10-CM | POA: Diagnosis not present

## 2012-01-29 DIAGNOSIS — E1149 Type 2 diabetes mellitus with other diabetic neurological complication: Secondary | ICD-10-CM | POA: Diagnosis not present

## 2012-01-29 DIAGNOSIS — I10 Essential (primary) hypertension: Secondary | ICD-10-CM | POA: Diagnosis not present

## 2012-01-29 DIAGNOSIS — G609 Hereditary and idiopathic neuropathy, unspecified: Secondary | ICD-10-CM | POA: Diagnosis not present

## 2012-01-29 DIAGNOSIS — E785 Hyperlipidemia, unspecified: Secondary | ICD-10-CM | POA: Diagnosis not present

## 2012-02-01 DIAGNOSIS — E1139 Type 2 diabetes mellitus with other diabetic ophthalmic complication: Secondary | ICD-10-CM | POA: Diagnosis not present

## 2012-02-01 DIAGNOSIS — H52229 Regular astigmatism, unspecified eye: Secondary | ICD-10-CM | POA: Diagnosis not present

## 2012-02-01 DIAGNOSIS — H33309 Unspecified retinal break, unspecified eye: Secondary | ICD-10-CM | POA: Diagnosis not present

## 2012-02-01 DIAGNOSIS — H43819 Vitreous degeneration, unspecified eye: Secondary | ICD-10-CM | POA: Diagnosis not present

## 2012-02-08 DIAGNOSIS — H01009 Unspecified blepharitis unspecified eye, unspecified eyelid: Secondary | ICD-10-CM | POA: Diagnosis not present

## 2012-03-15 DIAGNOSIS — R252 Cramp and spasm: Secondary | ICD-10-CM | POA: Diagnosis not present

## 2012-03-15 DIAGNOSIS — T148XXA Other injury of unspecified body region, initial encounter: Secondary | ICD-10-CM | POA: Diagnosis not present

## 2012-03-15 DIAGNOSIS — E1149 Type 2 diabetes mellitus with other diabetic neurological complication: Secondary | ICD-10-CM | POA: Diagnosis not present

## 2012-04-06 ENCOUNTER — Other Ambulatory Visit: Payer: Self-pay

## 2012-04-22 ENCOUNTER — Encounter: Payer: Self-pay | Admitting: Gynecologic Oncology

## 2012-04-22 ENCOUNTER — Ambulatory Visit: Payer: Medicare Other | Attending: Gynecology | Admitting: Gynecologic Oncology

## 2012-04-22 VITALS — BP 130/80 | HR 80 | Temp 98.8°F | Resp 16 | Wt 194.4 lb

## 2012-04-22 DIAGNOSIS — C55 Malignant neoplasm of uterus, part unspecified: Secondary | ICD-10-CM | POA: Diagnosis not present

## 2012-04-22 HISTORY — DX: Malignant neoplasm of uterus, part unspecified: C55

## 2012-04-22 NOTE — Patient Instructions (Addendum)
Doing well.  Plan to follow up in six months.  Try taking a B complex vitamin to improve neuropathy symptoms.  Please call for any questions or concerns.  Thank you for coming to see me today.  I appreciate your confidence in choosing [facility]necologic [facility] for your medical care.  If you have any questions about your visit today, please call our office and we will get back to you as soon as possible.  Warner Mccreedy, NP Gynecologic Oncology

## 2012-04-22 NOTE — Progress Notes (Signed)
Follow Up Note: Gyn-Onc  [name] 72 y.o. female  CC:  Chief Complaint  Patient presents with  . Uterine cancer    Follow up    HPI:  Lori Little is a 72 year old female who initially presented in late December 2009 to Dr. Donnetta Hail office with vaginal spotting.  An ultrasound was performed along with a repeat sonohystogram at a return visit in January 2010.  She presented to GYN Oncology in February 2010 as a new patient.  She underwent a total abdominal hysterectomy, bilateral salpingo-oophorectomy, and pelvic/ para-aortic lymphadenectomy in February 2010.  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  There was deep myometrial invasion with the tumor invading the entire endometrial cavity and lower uterine segment.  Furthermore, 2 out of 12 pelvic lymph nodes were positive.  Post-operatively, she received 6 cycles of carboplatin and taxol, which was completed in July 2010.  She has remained clinically free of disease since.       Interval History:  She returns today for continued follow up.  She reports the development of numbness in the fingertips bilaterally for the past 3.5 weeks.  She states that the right hand numbness resolved after massage during an appointment with a reflexologist one week ago but the left hand numbness persists.  She denies difficulty buttoning buttons or turning pages in a magazine.  She reports that Dr. Jacky Kindle is going to arrange a nerve conduction study soon.  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  Denies GI or GU symptoms.  Mammogram in May 2013 with normal findings.  Reporting last colonoscopy two to three years ago with Dr. Jacky Kindle managing her preventative care needs.  Review of Systems Constitutional: Feels well. Able to complete activities of daily living without difficulty.  Skin: No rash, sores, jaundice, itching,  dryness.  Cardiovascular: No chest pain, shortness of breath, or edema  Pulmonary: No cough or wheeze.  Gastro Intestinal: No nausea, vomiting, constipation, or diarrhea reported. No bright red blood per rectum, no abdominal pain, or change in bowel movement.  Genitourinary: No frequency, urgency, or dysuria.  Musculoskeletal: No myalgia, arthralgia, joint swelling or pain. Neurologic: No weakness or change in gait.  Bilateral fingertip numbness reported for the past three and a half weeks.  Psychology: No depression, anxiety, or insomnia.   Current Meds:  Outpatient Encounter Prescriptions as of 04/22/2012  Medication Sig Dispense Refill  . cholecalciferol (VITAMIN D) 1000 UNITS tablet Take 1,000 Units by mouth daily.      Marland Kitchen alendronate (FOSAMAX) 70 MG tablet Take 70 mg by mouth every 7 (seven) days. Take with a full glass of water on an empty stomach.       Marland Kitchen aspirin 81 MG tablet Take 81 mg by mouth daily.      . Calcium Carbonate-Vit D-Min (CALCIUM 1200 PO) Take 1 tablet by mouth 2 (two) times daily.      . Coenzyme Q10 (CO Q 10) 100 MG CAPS Take 1 tablet by mouth daily.      Marland Kitchen docusate sodium (COLACE) 100 MG capsule Take 100 mg by mouth 2 (two) times daily as needed.      . fish oil-omega-3 fatty acids 1000 MG capsule Take 1 g by mouth daily.      . Glucosamine-Chondroitin (COSAMIN DS PO) Take 1 tablet by mouth daily. 1500 Glucosamine &  1200 Chondroitin       . GRAPE SEED EXTRACT PO Take 2 tablets by mouth daily.      . Levothyroxine Sodium (SYNTHROID PO) Take 112 mcg by mouth daily.       . metformin (FORTAMET) 500 MG (OSM) 24 hr tablet Take 500 mg by mouth 3 (three) times daily with meals. 3 to 4 times a day      . Multiple Vitamins-Minerals (MULTIVITAMIN PO) Take 2 tablets by mouth daily.      Marland Kitchen olmesartan-hydrochlorothiazide (BENICAR HCT) 40-12.5 MG per tablet Take 1 tablet by mouth daily.      Marland Kitchen omeprazole (PRILOSEC) 20 MG capsule Take 20 mg by mouth daily.      . Red Yeast Rice 600  MG TABS Take 2 tablets by mouth daily.      . vitamin B-12 (CYANOCOBALAMIN) 1000 MCG tablet Take 1,000 mcg by mouth daily.      . [DISCONTINUED] Cholecalciferol (VITAMIN D) 400 UNITS capsule Take 400 Units by mouth 2 (two) times daily before a meal. With ca       No facility-administered encounter medications on file as of 04/22/2012.   Allergy:  Allergies  Allergen Reactions  . Sennosides Hives    Ex- Lax   Social Hx:   History   Social History  . Marital Status: Divorced    Spouse Name: N/A    Number of Children: N/A  . Years of Education: N/A   Occupational History  . Not on file.   Social History Main Topics  . Smoking status: Former Smoker -- 6 years    Quit date: 02/21/1976  . Smokeless tobacco: Not on file  . Alcohol Use: Not on file  . Drug Use: Not on file  . Sexually Active: Not on file   Other Topics Concern  . Not on file   Social History Narrative  . No narrative on file   Past Surgical Hx:  Past Surgical History  Procedure Laterality Date  . Umbilical hernia repair    . Ganglion cyst excision      Right wrist  . Tonsillectomy      with adenoidectomy  . Abdominal hysterectomy      TAH with BSO, pelvic & periaortic lymphadenectomy  . Cataract extraction     Past Medical Hx:  Past Medical History  Diagnosis Date  . Uterine cancer     uterine ca dx 2010  . Diabetes mellitus   . Hypertension   . Obesity   . Hypothyroidism   . Plantar fasciitis    Family Hx:  Family History  Problem Relation Age of Onset  . Lung cancer Mother     Vitals:  Blood pressure 130/80, pulse 80, temperature 98.8 F (37.1 C), temperature source Oral, resp. rate 16, weight 194 lb 6.4 oz (88.179 kg).  Physical Exam:  General: Well developed, well nourished female in no acute distress. Alert, oriented x3.  Cardiovascular: Heart rate and rhythm regular. S1 and S2 normal. No murmurs, clicks, or rubs noted.  Lungs: Clear to auscultation bilaterally. No wheezes,  crackles, or rhonchi.  Skin: No rash, lesions, or skin breakdown noted. Lymph:  No subclavicular, axillary, or inguinal adenopathy noted  Abdomen: Normoactive bowel sounds, abdomen soft, non-tender and obese.  No evidence of organomegaly or abdominal masses.  Pelvic: Vulva: Normal external female genitalia. No lesions.  EGBUS: Normal, No lesions or masses.  Vagina: No visible lesions or palpable vaginal/ pelvic masses. No discharge or bleeding noted.  Cervix and uterus surgically absent.  Rectal: Good rectal tone. No masses. No cul de sac nodularity.  Extremities: Moderate varicosities noted bilaterally with no edema.  No cyanosis.  Assessment/Plan:  72 year old female with Stage IIIC1 carcinosarcoma of the uterus.  Status post surgical resection in February 2010 and adjuvant chemotherapy including carboplatin and taxol, which was completed in July of 2010.  She is clinically free of disease with plans to follow up with GYN Oncology in 6 months for continued follow up.  She is advised to follow up with Dr. Jacky Kindle about obtaining the nerve conduction study and take Vitamin B6 twice daily to assist with neuropathy symptom relief.  She is advised to call for any questions or concerns.  Survivorship packet reviewed with reportable signs and symptoms, recommended preventative screenings per the Society of Gynecologic Oncology, and support services offered through the Cancer Center.   The patient was reviewed with Dr. Stanford Breed.    CROSS, MELISSA DEAL, NP 04/22/2012, 11:51 AM

## 2012-04-29 DIAGNOSIS — G5600 Carpal tunnel syndrome, unspecified upper limb: Secondary | ICD-10-CM | POA: Diagnosis not present

## 2012-06-10 DIAGNOSIS — K219 Gastro-esophageal reflux disease without esophagitis: Secondary | ICD-10-CM | POA: Diagnosis not present

## 2012-06-10 DIAGNOSIS — E1149 Type 2 diabetes mellitus with other diabetic neurological complication: Secondary | ICD-10-CM | POA: Diagnosis not present

## 2012-06-10 DIAGNOSIS — I10 Essential (primary) hypertension: Secondary | ICD-10-CM | POA: Diagnosis not present

## 2012-06-10 DIAGNOSIS — E785 Hyperlipidemia, unspecified: Secondary | ICD-10-CM | POA: Diagnosis not present

## 2012-06-10 DIAGNOSIS — G609 Hereditary and idiopathic neuropathy, unspecified: Secondary | ICD-10-CM | POA: Diagnosis not present

## 2012-07-02 DIAGNOSIS — Z1231 Encounter for screening mammogram for malignant neoplasm of breast: Secondary | ICD-10-CM | POA: Diagnosis not present

## 2012-07-03 DIAGNOSIS — E119 Type 2 diabetes mellitus without complications: Secondary | ICD-10-CM | POA: Diagnosis not present

## 2012-07-03 DIAGNOSIS — Z961 Presence of intraocular lens: Secondary | ICD-10-CM | POA: Diagnosis not present

## 2012-07-26 DIAGNOSIS — J029 Acute pharyngitis, unspecified: Secondary | ICD-10-CM | POA: Diagnosis not present

## 2012-07-26 DIAGNOSIS — I10 Essential (primary) hypertension: Secondary | ICD-10-CM | POA: Diagnosis not present

## 2012-07-26 DIAGNOSIS — E1149 Type 2 diabetes mellitus with other diabetic neurological complication: Secondary | ICD-10-CM | POA: Diagnosis not present

## 2012-07-29 ENCOUNTER — Encounter (HOSPITAL_COMMUNITY): Payer: Self-pay | Admitting: Cardiology

## 2012-07-29 ENCOUNTER — Inpatient Hospital Stay (HOSPITAL_COMMUNITY)
Admission: EM | Admit: 2012-07-29 | Discharge: 2012-07-30 | DRG: 313 | Disposition: A | Discharge status: Home / Self Care | Payer: Medicare Other | Attending: Cardiovascular Disease | Admitting: Cardiovascular Disease

## 2012-07-29 ENCOUNTER — Encounter (HOSPITAL_COMMUNITY)
Admission: EM | Disposition: A | Discharge status: Home / Self Care | Payer: Self-pay | Source: Home / Self Care | Attending: Cardiovascular Disease

## 2012-07-29 ENCOUNTER — Ambulatory Visit (HOSPITAL_COMMUNITY): Admission: RE | Admit: 2012-07-29 | Payer: Medicare Other | Source: Ambulatory Visit | Admitting: Cardiovascular Disease

## 2012-07-29 ENCOUNTER — Emergency Department (HOSPITAL_COMMUNITY): Payer: Medicare Other

## 2012-07-29 DIAGNOSIS — E039 Hypothyroidism, unspecified: Secondary | ICD-10-CM

## 2012-07-29 DIAGNOSIS — R000 Tachycardia, unspecified: Secondary | ICD-10-CM | POA: Diagnosis present

## 2012-07-29 DIAGNOSIS — E669 Obesity, unspecified: Secondary | ICD-10-CM | POA: Diagnosis present

## 2012-07-29 DIAGNOSIS — I319 Disease of pericardium, unspecified: Secondary | ICD-10-CM | POA: Diagnosis present

## 2012-07-29 DIAGNOSIS — Z8542 Personal history of malignant neoplasm of other parts of uterus: Secondary | ICD-10-CM

## 2012-07-29 DIAGNOSIS — R9431 Abnormal electrocardiogram [ECG] [EKG]: Secondary | ICD-10-CM | POA: Diagnosis not present

## 2012-07-29 DIAGNOSIS — I309 Acute pericarditis, unspecified: Secondary | ICD-10-CM | POA: Diagnosis not present

## 2012-07-29 DIAGNOSIS — Z79899 Other long term (current) drug therapy: Secondary | ICD-10-CM | POA: Diagnosis not present

## 2012-07-29 DIAGNOSIS — K219 Gastro-esophageal reflux disease without esophagitis: Secondary | ICD-10-CM | POA: Diagnosis not present

## 2012-07-29 DIAGNOSIS — R079 Chest pain, unspecified: Secondary | ICD-10-CM | POA: Diagnosis not present

## 2012-07-29 DIAGNOSIS — I313 Pericardial effusion (noninflammatory): Secondary | ICD-10-CM | POA: Diagnosis present

## 2012-07-29 DIAGNOSIS — I498 Other specified cardiac arrhythmias: Secondary | ICD-10-CM | POA: Diagnosis not present

## 2012-07-29 DIAGNOSIS — Z7982 Long term (current) use of aspirin: Secondary | ICD-10-CM | POA: Diagnosis not present

## 2012-07-29 DIAGNOSIS — Z6831 Body mass index (BMI) 31.0-31.9, adult: Secondary | ICD-10-CM | POA: Diagnosis not present

## 2012-07-29 DIAGNOSIS — R6889 Other general symptoms and signs: Secondary | ICD-10-CM | POA: Diagnosis not present

## 2012-07-29 DIAGNOSIS — M549 Dorsalgia, unspecified: Secondary | ICD-10-CM | POA: Diagnosis not present

## 2012-07-29 DIAGNOSIS — E1149 Type 2 diabetes mellitus with other diabetic neurological complication: Secondary | ICD-10-CM | POA: Diagnosis not present

## 2012-07-29 DIAGNOSIS — Z87891 Personal history of nicotine dependence: Secondary | ICD-10-CM

## 2012-07-29 DIAGNOSIS — J984 Other disorders of lung: Secondary | ICD-10-CM | POA: Diagnosis not present

## 2012-07-29 DIAGNOSIS — E119 Type 2 diabetes mellitus without complications: Secondary | ICD-10-CM | POA: Diagnosis not present

## 2012-07-29 DIAGNOSIS — I10 Essential (primary) hypertension: Secondary | ICD-10-CM | POA: Diagnosis not present

## 2012-07-29 DIAGNOSIS — E785 Hyperlipidemia, unspecified: Secondary | ICD-10-CM | POA: Diagnosis not present

## 2012-07-29 HISTORY — DX: Hypothyroidism, unspecified: E03.9

## 2012-07-29 HISTORY — DX: Essential (primary) hypertension: I10

## 2012-07-29 HISTORY — DX: Tachycardia, unspecified: R00.0

## 2012-07-29 HISTORY — DX: Pericardial effusion (noninflammatory): I31.3

## 2012-07-29 LAB — URINALYSIS, ROUTINE W REFLEX MICROSCOPIC
Glucose, UA: NEGATIVE mg/dL
Hgb urine dipstick: NEGATIVE
Ketones, ur: 15 mg/dL — AB
Nitrite: NEGATIVE
Protein, ur: NEGATIVE mg/dL
Specific Gravity, Urine: 1.025 (ref 1.005–1.030)
Urobilinogen, UA: 1 mg/dL (ref 0.0–1.0)
pH: 5.5 (ref 5.0–8.0)

## 2012-07-29 LAB — COMPREHENSIVE METABOLIC PANEL
ALT: 16 U/L (ref 0–35)
AST: 15 U/L (ref 0–37)
Albumin: 3.2 g/dL — ABNORMAL LOW (ref 3.5–5.2)
Alkaline Phosphatase: 74 U/L (ref 39–117)
BUN: 28 mg/dL — ABNORMAL HIGH (ref 6–23)
CO2: 26 mEq/L (ref 19–32)
Calcium: 9.4 mg/dL (ref 8.4–10.5)
Chloride: 96 mEq/L (ref 96–112)
Creatinine, Ser: 1.02 mg/dL (ref 0.50–1.10)
GFR calc Af Amer: 63 mL/min — ABNORMAL LOW (ref >90)
GFR calc non Af Amer: 54 mL/min — ABNORMAL LOW (ref >90)
Glucose, Bld: 149 mg/dL — ABNORMAL HIGH (ref 70–99)
Potassium: 3.9 mEq/L (ref 3.5–5.1)
Sodium: 135 mEq/L (ref 135–145)
Total Bilirubin: 1.6 mg/dL — ABNORMAL HIGH (ref 0.3–1.2)
Total Protein: 6.9 g/dL (ref 6.0–8.3)

## 2012-07-29 LAB — CK TOTAL AND CKMB (NOT AT ARMC)
CK, MB: 2.7 ng/mL (ref 0.3–4.0)
Relative Index: 2.3 (ref 0.0–2.5)
Total CK: 116 U/L (ref 7–177)

## 2012-07-29 LAB — CBC WITH DIFFERENTIAL/PLATELET
Basophils Absolute: 0 10*3/uL (ref 0.0–0.1)
Basophils Relative: 0 % (ref 0–1)
Eosinophils Absolute: 0.2 10*3/uL (ref 0.0–0.7)
Eosinophils Relative: 2 % (ref 0–5)
HCT: 38.8 % (ref 36.0–46.0)
Hemoglobin: 12.6 g/dL (ref 12.0–15.0)
Lymphocytes Relative: 14 % (ref 12–46)
Lymphs Abs: 1.2 10*3/uL (ref 0.7–4.0)
MCH: 27.4 pg (ref 26.0–34.0)
MCHC: 32.5 g/dL (ref 30.0–36.0)
MCV: 84.3 fL (ref 78.0–100.0)
Monocytes Absolute: 0.9 10*3/uL (ref 0.1–1.0)
Monocytes Relative: 11 % (ref 3–12)
Neutro Abs: 6.4 10*3/uL (ref 1.7–7.7)
Neutrophils Relative %: 74 % (ref 43–77)
Platelets: 231 10*3/uL (ref 150–400)
RBC: 4.6 MIL/uL (ref 3.87–5.11)
RDW: 14.1 % (ref 11.5–15.5)
WBC: 8.6 10*3/uL (ref 4.0–10.5)

## 2012-07-29 LAB — GLUCOSE, CAPILLARY
Glucose-Capillary: 95 mg/dL (ref 70–99)
Glucose-Capillary: 122 mg/dL — ABNORMAL HIGH (ref 70–99)

## 2012-07-29 LAB — PROTIME-INR
INR: 1.14 (ref 0.00–1.49)
Prothrombin Time: 14.4 seconds (ref 11.6–15.2)

## 2012-07-29 LAB — MAGNESIUM: Magnesium: 1.4 mg/dL — ABNORMAL LOW (ref 1.5–2.5)

## 2012-07-29 LAB — MRSA PCR SCREENING: MRSA by PCR: NEGATIVE

## 2012-07-29 LAB — APTT: aPTT: 41 seconds — ABNORMAL HIGH (ref 24–37)

## 2012-07-29 LAB — AMYLASE: Amylase: 34 U/L (ref 0–105)

## 2012-07-29 LAB — LIPASE, BLOOD: Lipase: 34 U/L (ref 11–59)

## 2012-07-29 LAB — HEMOGLOBIN A1C
Hgb A1c MFr Bld: 6.5 % — ABNORMAL HIGH (ref <5.7)
Mean Plasma Glucose: 140 mg/dL — ABNORMAL HIGH (ref <117)

## 2012-07-29 LAB — TSH: TSH: 0.139 u[IU]/mL — ABNORMAL LOW (ref 0.350–4.500)

## 2012-07-29 LAB — TROPONIN I
Troponin I: <0.3 ng/mL (ref <0.30)
Troponin I: <0.3 ng/mL (ref <0.30)

## 2012-07-29 LAB — URINE MICROSCOPIC-ADD ON

## 2012-07-29 LAB — D-DIMER, QUANTITATIVE: D-Dimer, Quant: 0.61 ug/mL-FEU — ABNORMAL HIGH (ref 0.00–0.48)

## 2012-07-29 SURGERY — LEFT HEART CATHETERIZATION WITH CORONARY ANGIOGRAM
Anesthesia: LOCAL

## 2012-07-29 MED ORDER — MAGNESIUM OXIDE 400 (241.3 MG) MG PO TABS
400.0000 mg | ORAL_TABLET | Freq: Two times a day (BID) | ORAL | Status: DC
  Administered 2012-07-29 – 2012-07-30 (×2): 400 mg via ORAL
  Filled 2012-07-29 (×3): qty 1

## 2012-07-29 MED ORDER — ACETAMINOPHEN 325 MG PO TABS: 650.0000 mg | ORAL_TABLET | ORAL | Status: DC | PRN

## 2012-07-29 MED ORDER — ONDANSETRON HCL 4 MG/2ML IJ SOLN: 4.0000 mg | Freq: Four times a day (QID) | INTRAMUSCULAR | Status: DC | PRN

## 2012-07-29 MED ORDER — SODIUM CHLORIDE 0.9 % IJ SOLN: 3.0000 mL | INTRAMUSCULAR | Status: DC | PRN

## 2012-07-29 MED ORDER — ASPIRIN EC 81 MG PO TBEC: 81.0000 mg | DELAYED_RELEASE_TABLET | Freq: Every day | ORAL | Status: DC

## 2012-07-29 MED ORDER — HEPARIN BOLUS VIA INFUSION
4000.0000 [IU] | Freq: Once | INTRAVENOUS | Status: AC
  Administered 2012-07-29: 4000 [IU] via INTRAVENOUS

## 2012-07-29 MED ORDER — IOHEXOL 350 MG/ML SOLN
100.0000 mL | Freq: Once | INTRAVENOUS | Status: AC | PRN
  Administered 2012-07-29: 100 mL via INTRAVENOUS

## 2012-07-29 MED ORDER — OLMESARTAN MEDOXOMIL-HCTZ 40-12.5 MG PO TABS: 1.0000 | ORAL_TABLET | Freq: Every day | ORAL | Status: DC

## 2012-07-29 MED ORDER — OMEGA-3-ACID ETHYL ESTERS 1 G PO CAPS
1.0000 g | ORAL_CAPSULE | Freq: Every day | ORAL | Status: DC
  Administered 2012-07-29 – 2012-07-30 (×2): 1 g via ORAL
  Filled 2012-07-29 (×2): qty 1

## 2012-07-29 MED ORDER — LEVOTHYROXINE SODIUM 112 MCG PO TABS
112.0000 ug | ORAL_TABLET | Freq: Every day | ORAL | Status: DC
  Administered 2012-07-30: 112 ug via ORAL
  Filled 2012-07-29 (×2): qty 1

## 2012-07-29 MED ORDER — ATORVASTATIN CALCIUM 20 MG PO TABS
20.0000 mg | ORAL_TABLET | Freq: Every day | ORAL | Status: DC
  Administered 2012-07-29: 20 mg via ORAL
  Filled 2012-07-29 (×2): qty 1

## 2012-07-29 MED ORDER — SODIUM CHLORIDE 0.9 % IJ SOLN
3.0000 mL | Freq: Two times a day (BID) | INTRAMUSCULAR | Status: DC
  Administered 2012-07-29 – 2012-07-30 (×2): 3 mL via INTRAVENOUS

## 2012-07-29 MED ORDER — NITROGLYCERIN 0.4 MG SL SUBL: 0.4000 mg | SUBLINGUAL_TABLET | SUBLINGUAL | Status: DC | PRN

## 2012-07-29 MED ORDER — SODIUM CHLORIDE 0.9 % IV SOLN: 250.0000 mL | INTRAVENOUS | Status: DC | PRN

## 2012-07-29 MED ORDER — VITAMIN B-12 1000 MCG PO TABS
1000.0000 ug | ORAL_TABLET | Freq: Every day | ORAL | Status: DC
  Administered 2012-07-29 – 2012-07-30 (×2): 1000 ug via ORAL
  Filled 2012-07-29 (×2): qty 1

## 2012-07-29 MED ORDER — HEPARIN (PORCINE) IN NACL 100-0.45 UNIT/ML-% IJ SOLN
1200.0000 [IU]/h | INTRAMUSCULAR | Status: DC
  Administered 2012-07-29: 900 [IU]/h via INTRAVENOUS
  Administered 2012-07-30: 1200 [IU]/h via INTRAVENOUS
  Filled 2012-07-29 (×2): qty 250

## 2012-07-29 MED ORDER — ASPIRIN 81 MG PO TABS: 81.0000 mg | ORAL_TABLET | Freq: Every day | ORAL | Status: DC

## 2012-07-29 MED ORDER — HYDROCHLOROTHIAZIDE 12.5 MG PO CAPS
12.5000 mg | ORAL_CAPSULE | Freq: Every day | ORAL | Status: DC
  Administered 2012-07-29 – 2012-07-30 (×2): 12.5 mg via ORAL
  Filled 2012-07-29 (×2): qty 1

## 2012-07-29 MED ORDER — METOPROLOL TARTRATE 25 MG PO TABS: 25.0000 mg | ORAL_TABLET | Freq: Two times a day (BID) | ORAL | Status: DC

## 2012-07-29 MED ORDER — INSULIN ASPART 100 UNIT/ML ~~LOC~~ SOLN
0.0000 [IU] | Freq: Three times a day (TID) | SUBCUTANEOUS | Status: DC
  Administered 2012-07-30: 2 [IU] via SUBCUTANEOUS

## 2012-07-29 MED ORDER — METOPROLOL TARTRATE 25 MG PO TABS
25.0000 mg | ORAL_TABLET | Freq: Two times a day (BID) | ORAL | Status: DC
  Administered 2012-07-29 – 2012-07-30 (×3): 25 mg via ORAL
  Filled 2012-07-29 (×3): qty 1

## 2012-07-29 MED ORDER — SODIUM CHLORIDE 0.9 % IV SOLN: INTRAVENOUS | Status: DC

## 2012-07-29 MED ORDER — OMEGA-3 FATTY ACIDS 1000 MG PO CAPS: 1.0000 g | ORAL_CAPSULE | Freq: Every day | ORAL | Status: DC

## 2012-07-29 MED ORDER — IRBESARTAN 300 MG PO TABS
300.0000 mg | ORAL_TABLET | Freq: Every day | ORAL | Status: DC
  Administered 2012-07-29 – 2012-07-30 (×2): 300 mg via ORAL
  Filled 2012-07-29 (×2): qty 1

## 2012-07-29 MED ORDER — ASPIRIN 81 MG PO CHEW
81.0000 mg | CHEWABLE_TABLET | Freq: Every day | ORAL | Status: DC
  Administered 2012-07-30: 81 mg via ORAL
  Filled 2012-07-29 (×2): qty 1

## 2012-07-29 NOTE — ED Notes (Signed)
According to EMS, the patient was seen at [facility] medical for throat and shoulder pain.  She came back in to [facility] for the same thing, the pain was lower, this time she took tums with some relief.  She had an EKG done and had changes so they called EMS and had her to be transported to [facility].

## 2012-07-29 NOTE — H&P (Signed)
Lori Little is an 72 y.o. female.    Primary Cardiologist:Lori Little ZOX:WRUEAVW,UJWJXBJ A, MD  Chief Complaint: Back and neck pain and chest pain, began last Wed. HPI:  72 year old WF with no prior cardiac hx. Developed throat and shoulder pain last Wed. No associated symptoms at that time.  Thursday she did have fever to 102.1.  Was seen by PCP's office on Friday and thought she may have viral syndrome vs, GERD which she has had in past or combination.  Sat. Her symptoms increased and her pain was more significant.  Her HR was also elevated on BP machine from 119 -124.  She took TUMS with improvement.   Since then she has felt somewhat better though HR continues to be elevated.  She saw Dr. Jacky Little today and she has new ST elevation in lead II and AVF no reciprocal changes.  HR is 124.    She was sent emergently to ER with possible STEMI diagnosis, but Lori Little does not feel EKG meets criteria for STEMI.  Will check labs.  Pt painfree.  When she did have discomfort her only associated symptom was Sat with mild SOB.    If troponin elevated will proceed with cardiac cath today.  Admit to stepdown. Otherwise if negative enzymes will plan  lexiscan tomorrow.   Past Medical History  Diagnosis Date  . Uterine cancer     uterine ca dx 2010  . Diabetes mellitus   . Hypertension   . Obesity   . Hypothyroidism   . Plantar fasciitis   . 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 HTN (hypertension) 07/29/2012  . Hypothyroid 07/29/2012    Past Surgical History  Procedure Laterality Date  . Umbilical hernia repair    . Ganglion cyst excision      Right wrist  . Tonsillectomy      with adenoidectomy  . Abdominal hysterectomy      TAH with BSO, pelvic & periaortic lymphadenectomy  . Cataract extraction      Family History  Problem Relation Age of Onset  . Lung cancer Mother   . Diabetes Father   . Healthy Sister   . Healthy Sister   . Healthy Sister    Social History:  reports  that she quit smoking about 36 years ago. She has never used smokeless tobacco. She reports that she does not drink alcohol or use illicit drugs. Divorced, 1 daughter.  Allergies:  Allergies  Allergen Reactions  . Sennosides Hives    Ex- Lax    Outpatient Medications: Fosamax 70 mg weekly Benicar H. CT 40-12.5 daily Metformin HCL 500 mg twice a day  levothyroxine 112 mcgs daily Prilosec 20 mg daily Aspirin 81 mg daily B6 100 mg daily B12 1000 micrograms daily Calcium 1200 with vitamin D 1200 mg daily Carlson's fish oil. 1000 mg daily Lori Little Q. 10 200 mg daily Cosamine glucosamine daily D3 2000 IUs daily grapeseed [place] daily Red yeast rice 2 daily Multivitamin with omega 2 daily Hylands leg cramp homeopathic tablets 2 as needed  Results for orders placed during the hospital encounter of 07/29/12 (from the past 48 hour(s))  CBC WITH DIFFERENTIAL     Status: None   Collection Time    07/29/12 12:50 PM      Result Value Range   WBC 8.6  4.0 - 10.5 K/uL   RBC 4.60  3.87 - 5.11 MIL/uL   Hemoglobin 12.6  12.0 - 15.0 g/dL  HCT 38.8  36.0 - 46.0 %   MCV 84.3  78.0 - 100.0 fL   MCH 27.4  26.0 - 34.0 pg   MCHC 32.5  30.0 - 36.0 g/dL   RDW 16.1  09.6 - 04.5 %   Platelets 231  150 - 400 K/uL   Neutrophils Relative % 74  43 - 77 %   Neutro Abs 6.4  1.7 - 7.7 K/uL   Lymphocytes Relative 14  12 - 46 %   Lymphs Abs 1.2  0.7 - 4.0 K/uL   Monocytes Relative 11  3 - 12 %   Monocytes Absolute 0.9  0.1 - 1.0 K/uL   Eosinophils Relative 2  0 - 5 %   Eosinophils Absolute 0.2  0.0 - 0.7 K/uL   Basophils Relative 0  0 - 1 %   Basophils Absolute 0.0  0.0 - 0.1 K/uL   No results found.  ROS: General:+ throat and shoulder pain, + fever up to 102.9 on Thursday, no weight changes Skin:no rashes or ulcers HEENT:no blurred vision, no congestion CV:see HPI PUL:see HPI GI:no diarrhea constipation or melena, thought this was indigestion GU:no hematuria, no dysuria MS:no joint pain,  no claudication Neuro:no syncope, no lightheadedness Endo:+ treateddiabetes, + treated thyroid disease, her cholesterol has been stable with red yeast rice   Blood pressure 124/63, pulse 118, temperature 99.5 F (37.5 C), temperature source Oral, resp. rate 18, SpO2 96.00%. PE: General:Pleasant affect, NAD Skin:Warm and dry, brisk capillary refill HEENT:normocephalic, sclera clear, mucus membranes moist Neck:supple, no JVD, no bruits  Heart:S1S2 RRR without murmur, gallup, rub or click Lungs:clear without rales, rhonchi, or wheezes WUJ:WJXB, non tender, + BS, do not palpate liver spleen or masses Ext:no lower ext edema, 2+ pedal pulses, 2+ radial pulses, + varicosities in lower extremities. Neuro:alert and oriented, MAE, follows commands, + facial symmetry    Assessment/Plan Principal Problem:   Abnormal EKG, mild ST elevation II, AVF without recipical changes and with tachycardia Active Problems:   DIABETES MELLITUS-TYPE II   Chest pain at rest, throat, shoulders back, none on arrival.    HTN (hypertension)   Hypothyroid  PLAN: NPO until results of troponin.  If neg, then continue with Heparin and plan for stress/lexiscan test in AM.   WBC wnl.  Checking amylase lipase, cardiac enzymes and hepatic function.  Admit to stepdown. Follow up EKG in AM.  Pt and her daughter are aware of the plan.  Will add statin for 24 hours but pt prefers not to take if cardiac work up negative.  Will also check echo.  [facility] R Nurse Practitioner Certified [facility] Pager [phone] 07/29/2012, 1:11 PM    Agree with note written by Lori Little RNP  CP off and on for 5 days. H/O GERD. Pt is currently pain free. She is mildly tachycardic for unclear reasons. Exam benign. EKG shows subtle ST elevation but she is not a STEMI. Will admit to stepdown. Heparinize and cycle enzymes. Only cath if recurrent CP and/or + enz. Otherwise OP myoview.  Lori Little 07/29/2012 3:30  PM

## 2012-07-29 NOTE — Progress Notes (Signed)
ANTICOAGULATION CONSULT NOTE - Initial Consult  Pharmacy Consult for Heparin Indication: chest pain/ACS  Allergies  Allergen Reactions  . Sennosides Hives    Ex- Lax    Patient Measurements:   Ht: 5 ft 4 in Wt: 85 kg Heparin Dosing Weight: 73 kg  Vital Signs: Temp: 98.4 F (36.9 C) (06/09 1411) Temp src: Oral (06/09 1411) BP: 113/63 mmHg (06/09 1411) Pulse Rate: 116 (06/09 1411)  Labs:  Recent Labs  07/29/12 1250  HGB 12.6  HCT 38.8  PLT 231  CREATININE 1.02  CKTOTAL 116  CKMB 2.7  TROPONINI <0.30    The CrCl is unknown because both a height and weight (above a minimum accepted value) are required for this calculation.   Medical History: Past Medical History  Diagnosis Date  . Uterine cancer     uterine ca dx 2010  . Diabetes mellitus   . Hypertension   . Obesity   . Hypothyroidism   . Plantar fasciitis   . 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 HTN (hypertension) 07/29/2012  . Hypothyroid 07/29/2012    Assessment: 72 y/o F who presents with back and neck pain that began last Wednesday. Troponin <0.30, Renal function good with Scr 1.02, CBC good, no bleeding reported per patient. PT/INR, aPTT are signed and held and will be drawn.   Goal of Therapy:  Heparin level 0.3-0.7 units/ml Monitor platelets by anticoagulation protocol: Yes   Plan:  -Heparin 4000 units BOLUS x 1 -Start heparin infusion at 900 units/hr -Check 6 hour HL at 2130 -Daily CBC/HL -f/u labs -Monitor for bleeding  Abran Duke, PharmD Clinical Pharmacist Phone: [phone] Pager: [phone] 07/29/2012 2:53 PM

## 2012-07-30 ENCOUNTER — Inpatient Hospital Stay (HOSPITAL_COMMUNITY): Payer: Medicare Other

## 2012-07-30 ENCOUNTER — Other Ambulatory Visit: Payer: Self-pay | Admitting: Physician Assistant

## 2012-07-30 ENCOUNTER — Encounter (HOSPITAL_COMMUNITY): Payer: Self-pay | Admitting: Cardiology

## 2012-07-30 DIAGNOSIS — E119 Type 2 diabetes mellitus without complications: Secondary | ICD-10-CM | POA: Diagnosis not present

## 2012-07-30 DIAGNOSIS — I313 Pericardial effusion (noninflammatory): Secondary | ICD-10-CM

## 2012-07-30 DIAGNOSIS — I319 Disease of pericardium, unspecified: Secondary | ICD-10-CM | POA: Diagnosis not present

## 2012-07-30 DIAGNOSIS — I309 Acute pericarditis, unspecified: Secondary | ICD-10-CM

## 2012-07-30 DIAGNOSIS — I10 Essential (primary) hypertension: Secondary | ICD-10-CM | POA: Diagnosis not present

## 2012-07-30 DIAGNOSIS — R9431 Abnormal electrocardiogram [ECG] [EKG]: Secondary | ICD-10-CM | POA: Diagnosis not present

## 2012-07-30 DIAGNOSIS — R000 Tachycardia, unspecified: Secondary | ICD-10-CM

## 2012-07-30 DIAGNOSIS — I3139 Other pericardial effusion (noninflammatory): Secondary | ICD-10-CM | POA: Diagnosis present

## 2012-07-30 DIAGNOSIS — E039 Hypothyroidism, unspecified: Secondary | ICD-10-CM | POA: Diagnosis not present

## 2012-07-30 DIAGNOSIS — R079 Chest pain, unspecified: Secondary | ICD-10-CM

## 2012-07-30 HISTORY — DX: Tachycardia, unspecified: R00.0

## 2012-07-30 HISTORY — DX: Pericardial effusion (noninflammatory): I31.3

## 2012-07-30 LAB — GLUCOSE, CAPILLARY
Glucose-Capillary: 132 mg/dL — ABNORMAL HIGH (ref 70–99)
Glucose-Capillary: 185 mg/dL — ABNORMAL HIGH (ref 70–99)
Glucose-Capillary: 153 mg/dL — ABNORMAL HIGH (ref 70–99)

## 2012-07-30 LAB — BASIC METABOLIC PANEL
BUN: 23 mg/dL (ref 6–23)
CO2: 26 mEq/L (ref 19–32)
Calcium: 9.2 mg/dL (ref 8.4–10.5)
Chloride: 99 mEq/L (ref 96–112)
Creatinine, Ser: 0.79 mg/dL (ref 0.50–1.10)
GFR calc Af Amer: >90 mL/min (ref >90)
GFR calc non Af Amer: 82 mL/min — ABNORMAL LOW (ref >90)
Glucose, Bld: 134 mg/dL — ABNORMAL HIGH (ref 70–99)
Potassium: 3.6 mEq/L (ref 3.5–5.1)
Sodium: 136 mEq/L (ref 135–145)

## 2012-07-30 LAB — LIPID PANEL
Cholesterol: 179 mg/dL (ref 0–200)
HDL: 42 mg/dL (ref >39)
LDL Cholesterol: 116 mg/dL — ABNORMAL HIGH (ref 0–99)
Total CHOL/HDL Ratio: 4.3 RATIO
Triglycerides: 103 mg/dL (ref <150)
VLDL: 21 mg/dL (ref 0–40)

## 2012-07-30 LAB — CBC
HCT: 38.5 % (ref 36.0–46.0)
Hemoglobin: 12.6 g/dL (ref 12.0–15.0)
MCH: 27.6 pg (ref 26.0–34.0)
MCHC: 32.7 g/dL (ref 30.0–36.0)
MCV: 84.4 fL (ref 78.0–100.0)
Platelets: 259 10*3/uL (ref 150–400)
RBC: 4.56 MIL/uL (ref 3.87–5.11)
RDW: 13.8 % (ref 11.5–15.5)
WBC: 7.7 10*3/uL (ref 4.0–10.5)

## 2012-07-30 LAB — TROPONIN I: Troponin I: <0.3 ng/mL (ref <0.30)

## 2012-07-30 LAB — MAGNESIUM: Magnesium: 1.5 mg/dL (ref 1.5–2.5)

## 2012-07-30 LAB — HEPARIN LEVEL (UNFRACTIONATED)
Heparin Unfractionated: <0.1 IU/mL — ABNORMAL LOW (ref 0.30–0.70)
Heparin Unfractionated: 0.24 IU/mL — ABNORMAL LOW (ref 0.30–0.70)

## 2012-07-30 MED ORDER — TECHNETIUM TC 99M SESTAMIBI GENERIC - CARDIOLITE
10.0000 | Freq: Once | INTRAVENOUS | Status: AC | PRN
  Administered 2012-07-30: 10 via INTRAVENOUS

## 2012-07-30 MED ORDER — ATORVASTATIN CALCIUM 20 MG PO TABS: 20.0000 mg | ORAL_TABLET | Freq: Every day | ORAL | Status: DC

## 2012-07-30 MED ORDER — TECHNETIUM TC 99M SESTAMIBI GENERIC - CARDIOLITE
30.0000 | Freq: Once | INTRAVENOUS | Status: AC | PRN
  Administered 2012-07-30: 30 via INTRAVENOUS

## 2012-07-30 MED ORDER — REGADENOSON 0.4 MG/5ML IV SOLN
0.4000 mg | Freq: Once | INTRAVENOUS | Status: AC
  Administered 2012-07-30: 0.4 mg via INTRAVENOUS

## 2012-07-30 MED ORDER — HEPARIN (PORCINE) IN NACL 100-0.45 UNIT/ML-% IJ SOLN
1400.0000 [IU]/h | INTRAMUSCULAR | Status: DC
  Filled 2012-07-30: qty 250

## 2012-07-30 MED ORDER — METOPROLOL TARTRATE 25 MG PO TABS: 25.0000 mg | ORAL_TABLET | Freq: Two times a day (BID) | ORAL | Status: DC

## 2012-07-30 MED ORDER — HEPARIN BOLUS VIA INFUSION
2000.0000 [IU] | Freq: Once | INTRAVENOUS | Status: AC
  Administered 2012-07-30: 2000 [IU] via INTRAVENOUS
  Filled 2012-07-30: qty 2000

## 2012-07-30 NOTE — Progress Notes (Signed)
Patient admitted emergently out of mouth is with inferior ST elevation and pattern concerning for unstable angina versus myocardial infarction. Appreciate cardiology quick assessment and admission. Findings as far included negative enzymes and pericardial infusion with stress testing pending this morning. In retrospect, one wonders as to whether this was a viral pericarditis given the fever and symptoms last week leading up to this. This having been said await for stress testing in cardiology input today. Did discuss all issues at length with patient today cardiology will determine ultimate disposition

## 2012-07-30 NOTE — Care Management Note (Signed)
    Page 1 of 1   07/30/2012     11:44:23 AM   CARE MANAGEMENT NOTE 07/30/2012  Patient:  SIDNIE, SWALLEY   Account Number:  [id number]  Date Initiated:  07/30/2012  Documentation initiated by:  Junius Creamer  Subjective/Objective Assessment:   adm w angina     Action/Plan:   lives alone, pcp dr Quillian Quince   Anticipated DC Date:     Anticipated DC Plan:        DC Planning Services  CM consult      Choice offered to / List presented to:             Status of service:   Medicare Important Message given?   (If response is "NO", the following Medicare IM given date fields will be blank) Date Medicare IM given:   Date Additional Medicare IM given:    Discharge Disposition:    Per UR Regulation:  Reviewed for med. necessity/level of care/duration of stay  If discussed at Long Length of Stay Meetings, dates discussed:    Comments:

## 2012-07-30 NOTE — Progress Notes (Signed)
Subjective: No CP or SOB.  She reported that TUMS helped a great deal with the CP at home.   Objective: Vital signs in last 24 hours: Temp:  [97.9 F (36.6 C)-100.2 F (37.9 C)] 98.4 F (36.9 C) (06/10 0805) Pulse Rate:  [93-124] 102 (06/10 0947) Resp:  [18-32] 27 (06/10 0947) BP: (98-129)/(46-70) 107/60 mmHg (06/10 0947) SpO2:  [94 %-100 %] 95 % (06/10 0947) Weight:  [163 lb 2.3 oz (74 kg)-188 lb 4.4 oz (85.4 kg)] 188 lb 4.4 oz (85.4 kg) (06/10 0341) Last BM Date: 07/28/12  Intake/Output from previous day: 06/09 0701 - 06/10 0700 In: 610 [P.O.:360; I.V.:250] Out: 1300 [Urine:1300] Intake/Output this shift:    Medications Current Facility-Administered Medications  Medication Dose Route Frequency Provider Last Rate Last Dose  . 0.9 %  sodium chloride infusion  250 mL Intravenous PRN Nada Boozer, NP      . 0.9 %  sodium chloride infusion   Intravenous Continuous Nada Boozer, NP 10 mL/hr at 07/29/12 2000 10 mL at 07/29/12 2000  . acetaminophen (TYLENOL) tablet 650 mg  650 mg Oral Q4H PRN Nada Boozer, NP      . aspirin chewable tablet 81 mg  81 mg Oral Daily Runell Gess, MD      . atorvastatin (LIPITOR) tablet 20 mg  20 mg Oral q1800 Nada Boozer, NP   20 mg at 07/29/12 1801  . heparin ADULT infusion 100 units/mL (25000 units/250 mL)  1,400 Units/hr Intravenous Continuous Jessica C Carney, RPH      . hydrochlorothiazide (MICROZIDE) capsule 12.5 mg  12.5 mg Oral Daily Runell Gess, MD   12.5 mg at 07/29/12 1800  . insulin aspart (novoLOG) injection 0-9 Units  0-9 Units Subcutaneous TID WC Nada Boozer, NP      . irbesartan (AVAPRO) tablet 300 mg  300 mg Oral Daily Abelino Derrick, PA-C   300 mg at 07/29/12 1800  . levothyroxine (SYNTHROID, LEVOTHROID) tablet 112 mcg  112 mcg Oral QAC breakfast Nada Boozer, NP   112 mcg at 07/30/12 0814  . magnesium oxide (MAG-OX) tablet 400 mg  400 mg Oral BID Nada Boozer, NP   400 mg at 07/29/12 2121  . metoprolol tartrate  (LOPRESSOR) tablet 25 mg  25 mg Oral BID Nada Boozer, NP   25 mg at 07/29/12 1800  . nitroGLYCERIN (NITROSTAT) SL tablet 0.4 mg  0.4 mg Sublingual Q5 Min x 3 PRN Nada Boozer, NP      . omega-3 acid ethyl esters (LOVAZA) capsule 1 g  1 g Oral Daily Runell Gess, MD   1 g at 07/29/12 1800  . ondansetron (ZOFRAN) injection 4 mg  4 mg Intravenous Q6H PRN Nada Boozer, NP      . sodium chloride 0.9 % injection 3 mL  3 mL Intravenous Q12H Nada Boozer, NP   3 mL at 07/29/12 2122  . sodium chloride 0.9 % injection 3 mL  3 mL Intravenous PRN Nada Boozer, NP      . vitamin B-12 (CYANOCOBALAMIN) tablet 1,000 mcg  1,000 mcg Oral Daily Nada Boozer, NP   1,000 mcg at 07/29/12 1800    PE: General appearance: alert, cooperative and no distress Lungs: clear to auscultation bilaterally Heart: regular rate and rhythm, S1, S2 normal, no murmur, click, rub or gallop Extremities: No LEE Pulses: 2+ and symmetric Neurologic: Grossly normal  Lab Results:   Recent Labs  07/29/12 1250 07/30/12 0631  WBC 8.6 7.7  HGB 12.6  12.6  HCT 38.8 38.5  PLT 231 259   BMET  Recent Labs  07/29/12 1250 07/30/12 0631  NA 135 136  K 3.9 3.6  CL 96 99  CO2 26 26  GLUCOSE 149* 134*  BUN 28* 23  CREATININE 1.02 0.79  CALCIUM 9.4 9.2   PT/INR  Recent Labs  07/29/12 1733  LABPROT 14.4  INR 1.14   Cholesterol  Recent Labs  07/30/12 0631  CHOL 179   Lipid Panel     Component Value Date/Time   CHOL 179 07/30/2012 0631   TRIG 103 07/30/2012 0631   HDL 42 07/30/2012 0631   CHOLHDL 4.3 07/30/2012 0631   VLDL 21 07/30/2012 0631   LDLCALC 116* 07/30/2012 0631     Cardiac Enzymes Cardiac Panel (last 3 results)  Recent Labs  07/29/12 1250 07/29/12 2048 07/30/12 0032  CKTOTAL 116  --   --   CKMB 2.7  --   --   TROPONINI <0.30 <0.30 <0.30  RELINDX 2.3  --   --     Studies/Results: @RISRSLT2 @   Assessment/Plan   Principal Problem:   Abnormal EKG, mild ST elevation II, AVF without  recipical changes and with tachycardia Active Problems:   DIABETES MELLITUS-TYPE II   Chest pain at rest, throat, shoulders back, none on arrival.    HTN (hypertension)   Hypothyroid- TSH 0.139, free T4 pending   Pericardial effusion on CTA-   Tachycardia  Plan:  R/O for MI.  She tolerated the [employer] very well with only a mild "nervous feeling" which resolved.  Results pending.  2D echo pending.  LDL elevated.  Is on a statin.   LOS: 1 day    HAGER, BRYAN 07/30/2012 10:11 AM  I have seen and examined the patient along with Wilburt Finlay, PA.  I have reviewed the chart, notes and new data.  I agree with PA's note.  Key new complaints: symptoms are mostly consistent with GI etiology and have now resolved completely. Key examination changes: no rub, no signs of CHF Key new findings / data: ECG shows subtle ST segment elevation in a pattern suggestive of acute pericarditis. Enzymes negative x3. Normal perfusion study.  PLAN: DC home. Outpatient echo F/U in 3-4 weeks to see if ECg changes have resolved.  Thurmon Fair, MD, [facility] [phone] 07/30/2012, 4:47 PM

## 2012-07-30 NOTE — Discharge Summary (Signed)
Physician Discharge Summary  Patient ID: Lori Little MRN: [id number] DOB/AGE: [birth year] 71 y.o.  Admit date: 07/29/2012 Discharge date: 07/30/2012  Admission Diagnoses: Chest pain   Discharge Diagnoses:  Principal Problem:   Abnormal EKG, mild ST elevation II, AVF without recipical changes and with tachycardia Active Problems:   DIABETES MELLITUS-TYPE II   Chest pain at rest, throat, shoulders back, none on arrival.    HTN (hypertension)   Hypothyroid- TSH 0.139, free T4 pending   Pericardial effusion on CTA-   Tachycardia   Discharged Condition: stable  Hospital Course:  72 year old WF with no prior cardiac hx. Developed throat and shoulder pain last Wed. No associated symptoms at that time. Thursday she did have fever to 102.1. Was seen by PCP's office on Friday and thought she may have viral syndrome vs, GERD which she has had in past or combination. Sat. Her symptoms increased and her pain was more significant. Her HR was also elevated on BP machine from 119 -124. She took [place] with a lot of improvement. Since then she has felt somewhat better though HR continues to be elevated. She saw Dr. Jacky Kindle today and she has new ST elevation in lead II and AVF no reciprocal changes. HR is 124.   She was sent emergently to ER with possible STEMI diagnosis, but Dr. Allyson Sabal did not feel EKG met criteria for STEMI.  Pt painfree. When she did have discomfort her only associated symptom was Sat with mild SOB.   She was admitted to observation and ruled-out for MI with three negative troponins.  Lexiscan myoview was negative for ischemia. CT angio of her chest revealed no pulmonary embolism but she does have a small to moderate pericardial effusion as well as an unchanged, tiny, pulmonary nodules.  She will be scheduled for outpatient 2D echocardiogram to follow the effusion.  The patient was seen by Dr. Royann Shivers who felt she was stable for DC home.   Consults: None  Significant Diagnostic Studies:   PORTABLE CHEST - 1 VIEW  Comparison: 07/14/2008  Findings: The cardiomediastinal silhouette is unremarkable. There is no evidence of focal airspace disease, pulmonary edema, suspicious pulmonary nodule/mass, pleural effusion, or pneumothorax. No acute bony abnormalities are identified.  IMPRESSION: No evidence of active cardiopulmonary disease.    CT ANGIOGRAPHY CHEST  Technique: Multidetector CT imaging of the chest using the standard protocol during bolus administration of intravenous contrast. Multiplanar reconstructed images including MIPs were obtained and reviewed to evaluate the vascular anatomy.  Contrast: OMNIPAQUE IOHEXOL 350 MG/ML SOLN  Comparison: CT chest dated 11/24/2010  Findings: No evidence of pulmonary embolism.  Stable mild sub-5 mm nodularity in the bilateral upper lobes (series 5/images 37-38) and right middle lobe (series 5/image 48), and mild nodularity along the right major fissure (series 5/image 42), unchanged since 2012, benign.  Mild dependent atelectasis in the bilateral lower lobes. No pleural effusion or pneumothorax.  Visualized thyroid is unremarkable.  Cardiomegaly. Small-to-moderate pericardial effusion, new. Atherosclerotic calcifications of the aortic arch.  No suspicious mediastinal, hilar, or axillary lymphadenopathy.  Visualized upper abdomen is notable for mild hepatic steatosis and suspected small layering gallstones (series 4/image 87).  Mild degenerative changes of the visualized thoracolumbar spine.  IMPRESSION: No evidence of pulmonary embolism. Cardiomegaly with small to moderate pericardial effusion, new. Given the clinical history, correlate for pericarditis. Stable tiny pulmonary nodules, unchanged since 2012, benign.  CBC    Component Value Date/Time   WBC 7.7 07/30/2012 0631   WBC 3.0* 10/07/2008  1052   RBC 4.56 07/30/2012 0631   RBC 3.20* 10/07/2008 1052   HGB 12.6 07/30/2012 0631   HGB 11.4*  10/07/2008 1052   HCT 38.5 07/30/2012 0631   HCT 32.9* 10/07/2008 1052   PLT 259 07/30/2012 0631   PLT 264 10/07/2008 1052   MCV 84.4 07/30/2012 0631   MCV 102.7* 10/07/2008 1052   MCH 27.6 07/30/2012 0631   MCH 35.6* 10/07/2008 1052   MCHC 32.7 07/30/2012 0631   MCHC 34.7 10/07/2008 1052   RDW 13.8 07/30/2012 0631   RDW 18.3* 10/07/2008 1052   LYMPHSABS 1.2 07/29/2012 1250   LYMPHSABS 0.9 10/07/2008 1052   MONOABS 0.9 07/29/2012 1250   MONOABS 0.3 10/07/2008 1052   EOSABS 0.2 07/29/2012 1250   EOSABS 0.0 10/07/2008 1052   BASOSABS 0.0 07/29/2012 1250   BASOSABS 0.0 10/07/2008 1052    BMET    Component Value Date/Time   NA 136 07/30/2012 0631   K 3.6 07/30/2012 0631   CL 99 07/30/2012 0631   CO2 26 07/30/2012 0631   GLUCOSE 134* 07/30/2012 0631   BUN 23 07/30/2012 0631   CREATININE 0.79 07/30/2012 0631   CALCIUM 9.2 07/30/2012 0631   GFRNONAA 82* 07/30/2012 0631   GFRAA >90 07/30/2012 0631   Lipid Panel     Component Value Date/Time   CHOL 179 07/30/2012 0631   TRIG 103 07/30/2012 0631   HDL 42 07/30/2012 0631   CHOLHDL 4.3 07/30/2012 0631   VLDL 21 07/30/2012 0631   LDLCALC 116* 07/30/2012 0631    Treatments: See above  Discharge Exam: Blood pressure 104/67, pulse 107, temperature 98.4 F (36.9 C), temperature source Oral, resp. rate 25, height 5\' 4"  (1.626 m), weight 188 lb 4.4 oz (85.4 kg), SpO2 96.00%.   Disposition: ED Dismiss - Never Arrived     Medication List    ASK your doctor about these medications       alendronate 70 MG tablet  Commonly known as:  FOSAMAX  Take 70 mg by mouth every 7 (seven) days. Takes on Fridays. Take with a full glass of water on an empty stomach.     aspirin EC 81 MG tablet  Take 81 mg by mouth daily.     CALCIUM 1200 PO  Take 1 tablet by mouth 2 (two) times daily.     Coenzyme Q10 200 MG capsule  Take 200 mg by mouth daily.     COSAMIN DS PO  Take 1 tablet by mouth daily. 1500 Glucosamine & 1200 Chondroitin     docusate sodium 100 MG capsule   Commonly known as:  COLACE  Take 100 mg by mouth daily.     fish oil-omega-3 fatty acids 1000 MG capsule  Take 1 g by mouth daily.     GRAPE SEED EXTRACT PO  Take 2 tablets by mouth daily.     metformin 500 MG (OSM) 24 hr tablet  Commonly known as:  FORTAMET  Take 500 mg by mouth 3 (three) times daily with meals. 3 to 4 times a day     MULTIVITAMIN PO  Take 2 tablets by mouth daily.     olmesartan-hydrochlorothiazide 40-12.5 MG per tablet  Commonly known as:  BENICAR HCT  Take 1 tablet by mouth daily.     omeprazole 20 MG capsule  Commonly known as:  PRILOSEC  Take 20 mg by mouth daily.     pyridOXINE 100 MG tablet  Commonly known as:  VITAMIN B-6  Take 100 mg  by mouth daily.     Red Yeast Rice 600 MG Tabs  Take 2 tablets by mouth daily.     SYNTHROID PO  Take 112 mcg by mouth daily.     vitamin B-12 1000 MCG tablet  Commonly known as:  CYANOCOBALAMIN  Take 1,000 mcg by mouth daily.         SignedWilburt Finlay 07/30/2012, 2:35 PM

## 2012-07-30 NOTE — Progress Notes (Signed)
ANTICOAGULATION CONSULT NOTE - Follow Up Consult  Pharmacy Consult for heparin Indication: chest pain/ACS  Labs:  Recent Labs  07/29/12 1250 07/29/12 1733 07/29/12 2048 07/29/12 2219  HGB 12.6  --   --   --   HCT 38.8  --   --   --   PLT 231  --   --   --   APTT  --  41*  --   --   LABPROT  --  14.4  --   --   INR  --  1.14  --   --   HEPARINUNFRC  --   --   --  <0.10*  CREATININE 1.02  --   --   --   CKTOTAL 116  --   --   --   CKMB 2.7  --   --   --   TROPONINI <0.30  --  <0.30  --     Assessment: 72yo female undetectable on heparin with initial dosing for CP.  Goal of Therapy:  Heparin level 0.3-0.7 units/ml   Plan:  Will rebolus with heparin 2000 units and increase gtt by 4 units/kg/hr to 1200 units/hr and check level in 6-8hr.  Vernard Gambles, PharmD, BCPS  07/30/2012,1:23 AM

## 2012-07-30 NOTE — Progress Notes (Signed)
D/c instructions reviewed w/Pt . Pt verbalized understanding.

## 2012-07-30 NOTE — Progress Notes (Signed)
ANTICOAGULATION CONSULT NOTE - Follow - up Consult  Pharmacy Consult for Heparin Indication: chest pain/ACS  Allergies  Allergen Reactions  . Sennosides Hives    Ex- Lax    Patient Measurements: Height: 5\' 4"  (162.6 cm) Weight: 188 lb 4.4 oz (85.4 kg) IBW/kg (Calculated) : 54.7 Ht: 5 ft 4 in Wt: 85 kg Heparin Dosing Weight: 73 kg  Vital Signs: Temp: 98.4 F (36.9 C) (06/10 0805) Temp src: Oral (06/10 0805) BP: 107/60 mmHg (06/10 0947) Pulse Rate: 102 (06/10 0947)  Labs:  Recent Labs  07/29/12 1250 07/29/12 1733 07/29/12 2048 07/29/12 2219 07/30/12 0032 07/30/12 0631 07/30/12 0756  HGB 12.6  --   --   --   --  12.6  --   HCT 38.8  --   --   --   --  38.5  --   PLT 231  --   --   --   --  259  --   APTT  --  41*  --   --   --   --   --   LABPROT  --  14.4  --   --   --   --   --   INR  --  1.14  --   --   --   --   --   HEPARINUNFRC  --   --   --  <0.10*  --   --  0.24*  CREATININE 1.02  --   --   --   --  0.79  --   CKTOTAL 116  --   --   --   --   --   --   CKMB 2.7  --   --   --   --   --   --   TROPONINI <0.30  --  <0.30  --  <0.30  --   --     Estimated Creatinine Clearance: 68.2 ml/min (by C-G formula based on Cr of 0.79).   Medical History: Past Medical History  Diagnosis Date  . Uterine cancer     uterine ca dx 2010  . Diabetes mellitus   . Hypertension   . Obesity   . Hypothyroidism   . Plantar fasciitis   . 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 HTN (hypertension) 07/29/2012  . Hypothyroid 07/29/2012  . Pericardial effusion 07/30/2012  . Tachycardia 07/30/2012    Assessment: 72 y/o F who presents with back and neck pain that began last Wednesday. Troponin <0.30, Renal function good with Scr 1.02, CBC stable, no bleeding noted per chart notes.  Heparin level subtherapeutic on current rate of heparin 1200 units/hr.  Patient currently in stress test.  Goal of Therapy:  Heparin level 0.3-0.7 units/ml Monitor platelets by anticoagulation  protocol: Yes   Plan:  1. Increase IV heparin to 1400 units/hr. 2. Recheck heparin level 6 hrs after rate increase. 3. F/U plans for anticoagulation after stress test. 4. Daily heparin level and CBC if heparin continues.  Tad Moore, BCPS  Clinical Pharmacist Pager [phone]  07/30/2012 9:57 AM

## 2012-07-31 LAB — T4, FREE: Free T4: 1.63 ng/dL (ref 0.80–1.80)

## 2012-07-31 LAB — TSH: TSH: 0.118 u[IU]/mL — ABNORMAL LOW (ref 0.350–4.500)

## 2012-08-07 ENCOUNTER — Inpatient Hospital Stay (HOSPITAL_COMMUNITY): Admission: RE | Admit: 2012-08-07 | Payer: Medicare Other | Source: Ambulatory Visit

## 2012-08-09 ENCOUNTER — Ambulatory Visit (HOSPITAL_COMMUNITY)
Admission: RE | Admit: 2012-08-09 | Discharge: 2012-08-09 | Disposition: A | Discharge status: Home / Self Care | Payer: Medicare Other | Source: Ambulatory Visit | Attending: Physician Assistant | Admitting: Physician Assistant

## 2012-08-09 DIAGNOSIS — I10 Essential (primary) hypertension: Secondary | ICD-10-CM | POA: Diagnosis not present

## 2012-08-09 DIAGNOSIS — I313 Pericardial effusion (noninflammatory): Secondary | ICD-10-CM

## 2012-08-09 DIAGNOSIS — I319 Disease of pericardium, unspecified: Secondary | ICD-10-CM | POA: Diagnosis not present

## 2012-08-09 DIAGNOSIS — E669 Obesity, unspecified: Secondary | ICD-10-CM | POA: Insufficient documentation

## 2012-08-09 DIAGNOSIS — R079 Chest pain, unspecified: Secondary | ICD-10-CM

## 2012-08-09 DIAGNOSIS — E119 Type 2 diabetes mellitus without complications: Secondary | ICD-10-CM | POA: Insufficient documentation

## 2012-08-09 NOTE — Progress Notes (Signed)
[facility]   2D echo completed 08/09/2012.   Cindy Shalon Salado, RDCS  

## 2012-08-15 ENCOUNTER — Ambulatory Visit (INDEPENDENT_AMBULATORY_CARE_PROVIDER_SITE_OTHER): Payer: Medicare Other | Admitting: Physician Assistant

## 2012-08-15 ENCOUNTER — Encounter: Payer: Self-pay | Admitting: Physician Assistant

## 2012-08-15 VITALS — BP 122/80 | HR 88 | Ht 63.5 in | Wt 185.1 lb

## 2012-08-15 DIAGNOSIS — E669 Obesity, unspecified: Secondary | ICD-10-CM | POA: Diagnosis not present

## 2012-08-15 DIAGNOSIS — I319 Disease of pericardium, unspecified: Secondary | ICD-10-CM | POA: Diagnosis not present

## 2012-08-15 DIAGNOSIS — I313 Pericardial effusion (noninflammatory): Secondary | ICD-10-CM

## 2012-08-15 DIAGNOSIS — E785 Hyperlipidemia, unspecified: Secondary | ICD-10-CM | POA: Diagnosis not present

## 2012-08-15 DIAGNOSIS — I10 Essential (primary) hypertension: Secondary | ICD-10-CM | POA: Diagnosis not present

## 2012-08-15 NOTE — Patient Instructions (Addendum)
Decrease lopressor to 12.5 mg tonight and for the next three days, twice daily.  Start ibuprofen 400 mg twice daily for the next 5 days.  We'll schedule for followup 2-D echocardiogram in 3 month.  Okay to resume exercise on treadmill.

## 2012-08-15 NOTE — Assessment & Plan Note (Signed)
Patient's LDL is mildly elevated. She has expressed concerns about taking a statin drug and is worried about the side effects. She is actively working on weight loss with [employer], taking red yeast rice and exercising.  I told her it be okay to stop the statin and we can recheck her cholesterol in 6 months to a year. HDL and triglycerides are at target.

## 2012-08-15 NOTE — Assessment & Plan Note (Signed)
Patient brought in a chart of her blood pressures for last few weeks. She's had some episodes of hypotension as well as his 70s systolic. Will go ahead and discontinue her Lopressor. She'll take 12.5 mg tonight and twice daily for the next 3 nights and then discontinue it altogether.

## 2012-08-15 NOTE — Assessment & Plan Note (Signed)
Patient reports she is currently doing [employer]. She has declined a nutritional consult at this time.

## 2012-08-15 NOTE — Assessment & Plan Note (Addendum)
Recent 2-D echocardiogram reveals a moderate sized posterior, free-flowing circumferential pericardial effusion with no significant tamponade physiology.  We'll recheck 2-D echocardiogram in 3 months.  It was thought that the patient initially presented to hospital with pericarditis which may be the cause of her fusion. She does have a faint rub on exam but no further chest pain. I have asked her to start 400 mg of ibuprofen twice daily for the next 5 days to help with any residual inflammation.

## 2012-08-15 NOTE — Progress Notes (Signed)
Date:  08/15/2012   ID:  Lori Little, DOB 1940-07-02, MRN [id number]  PCP:  Minda Meo, MD  Primary Cardiologist:       History of Present Illness: Lori Little is a 72 y.o. female   73 year old WF with no prior cardiac hx. Developed throat and shoulder pain last Wed. No associated symptoms at that time. Thursday she did have fever to 102.1. Was seen by PCP's office on Friday and thought she may have viral syndrome vs, GERD which she has had in past, or combination. Sat. her symptoms increased and her pain was more significant. Her HR was also elevated on BP machine from 119 -124. She took [place] with a lot of improvement. Since then she has felt somewhat better though HR continues to be elevated. She saw Dr. Jacky Kindle who saw new ST elevation in lead II and AVF no reciprocal changes. HR is 124.   She was sent emergently to ER with possible STEMI diagnosis, but Dr. Allyson Sabal did not feel EKG met criteria for STEMI. Pt painfree. When she did have discomfort her only associated symptom on Sat was mild SOB.   She was admitted to observation and ruled-out for MI with three negative troponins. Lexiscan myoview was negative for ischemia. CT angio of her chest revealed no pulmonary embolism but she does have a small to moderate pericardial effusion as well as an unchanged, tiny, pulmonary nodules.   Patient presented in followup today. She reports feeling very well with no further chest pain or nausea, vomiting, fever, shortness of breath, orthopnea, dizziness, PND, cough, congestion, abdominal pain, hematochezia, melena, lower extremity edema, claudication.    Wt Readings from Last 3 Encounters:  08/15/12 185 lb 1.6 oz (83.961 kg)  07/30/12 188 lb 4.4 oz (85.4 kg)  07/30/12 188 lb 4.4 oz (85.4 kg)     Past Medical History  Diagnosis Date  . Uterine cancer     uterine ca dx 2010  . Diabetes mellitus   . Hypertension   . Obesity   . Hypothyroidism   . Plantar fasciitis   .  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 HTN (hypertension) 07/29/2012  . Hypothyroid 07/29/2012  . Pericardial effusion 07/30/2012  . Tachycardia 07/30/2012    Current Outpatient Prescriptions  Medication Sig Dispense Refill  . alendronate (FOSAMAX) 70 MG tablet Take 70 mg by mouth every 7 (seven) days. Takes on Fridays. Take with a full glass of water on an empty stomach.      Marland Kitchen aspirin EC 81 MG tablet Take 81 mg by mouth daily.      . bisacodyl (DULCOLAX) 5 MG EC tablet Take 5 mg by mouth daily as needed for constipation.      . Calcium Carbonate-Vit D-Min (CALCIUM 1200 PO) Take 1 tablet by mouth 2 (two) times daily.      . Coenzyme Q10 200 MG capsule Take 200 mg by mouth daily.      . fish oil-omega-3 fatty acids 1000 MG capsule Take 1 g by mouth daily.      . Glucosamine-Chondroitin (COSAMIN DS PO) Take 1 tablet by mouth daily. 1500 Glucosamine & 1200 Chondroitin       . GRAPE SEED EXTRACT PO Take 2 tablets by mouth daily.      . Levothyroxine Sodium (SYNTHROID PO) Take 112 mcg by mouth daily.       . metformin (FORTAMET) 500 MG (OSM) 24 hr tablet Take 500 mg by mouth 3 (three) times  daily with meals. 3 to 4 times a day      . metoprolol tartrate (LOPRESSOR) 25 MG tablet Take 1 tablet (25 mg total) by mouth 2 (two) times daily.  60 tablet  5  . Multiple Vitamins-Minerals (MULTIVITAMIN PO) Take 2 tablets by mouth daily.      Marland Kitchen olmesartan-hydrochlorothiazide (BENICAR HCT) 40-12.5 MG per tablet Take 1 tablet by mouth daily.      Marland Kitchen omeprazole (PRILOSEC) 20 MG capsule Take 20 mg by mouth daily.      Marland Kitchen pyridOXINE (VITAMIN B-6) 100 MG tablet Take 100 mg by mouth daily.      . Red Yeast Rice 600 MG TABS Take 2 tablets by mouth daily.      . vitamin B-12 (CYANOCOBALAMIN) 1000 MCG tablet Take 1,000 mcg by mouth daily.       No current facility-administered medications for this visit.    Allergies:    Allergies  Allergen Reactions  . Sennosides Hives    Ex- Lax    Social History:  The patient   reports that she quit smoking about 36 years ago. She has never used smokeless tobacco. She reports that she does not drink alcohol or use illicit drugs.   ROS:  Please see the history of present illness.  All other systems reviewed and negative.   PHYSICAL EXAM: VS:  BP 122/80  Pulse 88  Ht 5' 3.5" (1.613 m)  Wt 185 lb 1.6 oz (83.961 kg)  BMI 32.27 kg/m2 Well nourished, well developed, in no acute distress HEENT: Pupils are equal round react to light accommodation extraocular movements are intact.  Neck: no JVDNo cervical lymphadenopathy. Cardiac: Regular rate and rhythm without murmurs.  Possible soft pericardial rub. Lungs:  clear to auscultation bilaterally, no wheezing, rhonchi or rales Ext: no lower extremity edema.  2+ radial and dorsalis pedis pulses. Skin: warm and dry Neuro:  Grossly normal  2D echo Study Conclusions  - Left ventricle: The cavity size was normal. Wall thickness was normal. Systolic function was normal. The estimated ejection fraction was in the range of 55% to 60%. Wall motion was normal; there were no regional wall motion abnormalities. Doppler parameters are consistent with abnormal left ventricular relaxation (grade 1 diastolic dysfunction). The E/e' ratio is<10, suggesting normal LV filling pressure. - Aortic valve: Sclerosis without stenosis. No regurgitation. - Left atrium: LA Volume/BSA 31.1 ml/m2. The atrium was at the upper limits of normal in size. - Right ventricle: The cavity size was normal. Systolic function was normal. There is enhanced echogenicity and focal thickening of the RV free wall, possibly suggesting an infiltrative process. - Tricuspid valve: Mild regurgitation. - Pulmonic valve: Trivial regurgitation. - Pulmonary arteries: PA peak pressure: 40mm Hg (S). - Inferior vena cava: The vessel was normal in size; the respirophasic diameter changes were in the normal range (= 50%); findings are consistent with normal central  venous pressure. - Pericardium, extracardiac: Moderate sized, mostly posterior, free-flowing circumferential pericardial effusion. There are no significant echocardiographic features of tamponade physiology, however, clinical correlation is always recommended.  Lipid Panel     Component Value Date/Time   CHOL 179 07/30/2012 0631   TRIG 103 07/30/2012 0631   HDL 42 07/30/2012 0631   CHOLHDL 4.3 07/30/2012 0631   VLDL 21 07/30/2012 0631   LDLCALC 116* 07/30/2012 0631    ASSESSMENT AND PLAN:  Problem List Items Addressed This Visit   HTN (hypertension) (Chronic)     Patient brought in a chart of her blood  pressures for last few weeks. She's had some episodes of hypotension as well as his 70s systolic. Will go ahead and discontinue her Lopressor. She'll take 12.5 mg tonight and twice daily for the next 3 nights and then discontinue it altogether.    Hyperlipidemia     Patient's LDL is mildly elevated. She has expressed concerns about taking a statin drug and is worried about the side effects. She is actively working on weight loss with [employer], taking red yeast rice and exercising.  I told her it be okay to stop the statin and we can recheck her cholesterol in 6 months to a year. HDL and triglycerides are at target.     Obesity (BMI 30.0-34.9)     Patient reports she is currently doing [employer]. She has declined a nutritional consult at this time.    Pericardial effusion on CTA- - Primary     Recent 2-D echocardiogram reveals a moderate sized posterior, free-flowing circumferential pericardial effusion with no significant tamponade physiology.  We'll recheck 2-D echocardiogram in 3 months.  It was thought that the patient initially presented to hospital with pericarditis which may be the cause of her fusion. She does have a faint rub on exam but no further chest pain. I have asked her to start 400 mg of ibuprofen twice daily for the next 5 days to help with any residual  inflammation.    Relevant Orders      2D Echocardiogram without contrast

## 2012-09-25 ENCOUNTER — Other Ambulatory Visit: Payer: Self-pay

## 2012-10-11 DIAGNOSIS — I10 Essential (primary) hypertension: Secondary | ICD-10-CM | POA: Diagnosis not present

## 2012-10-11 DIAGNOSIS — M810 Age-related osteoporosis without current pathological fracture: Secondary | ICD-10-CM | POA: Diagnosis not present

## 2012-10-11 DIAGNOSIS — E1149 Type 2 diabetes mellitus with other diabetic neurological complication: Secondary | ICD-10-CM | POA: Diagnosis not present

## 2012-10-11 DIAGNOSIS — E785 Hyperlipidemia, unspecified: Secondary | ICD-10-CM | POA: Diagnosis not present

## 2012-10-18 DIAGNOSIS — E1149 Type 2 diabetes mellitus with other diabetic neurological complication: Secondary | ICD-10-CM | POA: Diagnosis not present

## 2012-10-18 DIAGNOSIS — Z1331 Encounter for screening for depression: Secondary | ICD-10-CM | POA: Diagnosis not present

## 2012-10-18 DIAGNOSIS — E785 Hyperlipidemia, unspecified: Secondary | ICD-10-CM | POA: Diagnosis not present

## 2012-10-18 DIAGNOSIS — M810 Age-related osteoporosis without current pathological fracture: Secondary | ICD-10-CM | POA: Diagnosis not present

## 2012-10-18 DIAGNOSIS — Z23 Encounter for immunization: Secondary | ICD-10-CM | POA: Diagnosis not present

## 2012-10-18 DIAGNOSIS — M1990 Unspecified osteoarthritis, unspecified site: Secondary | ICD-10-CM | POA: Diagnosis not present

## 2012-10-18 DIAGNOSIS — I10 Essential (primary) hypertension: Secondary | ICD-10-CM | POA: Diagnosis not present

## 2012-10-18 DIAGNOSIS — K219 Gastro-esophageal reflux disease without esophagitis: Secondary | ICD-10-CM | POA: Diagnosis not present

## 2012-10-18 DIAGNOSIS — Z0000 Encounter for general adult medical examination without abnormal findings: Secondary | ICD-10-CM | POA: Diagnosis not present

## 2012-10-18 DIAGNOSIS — G609 Hereditary and idiopathic neuropathy, unspecified: Secondary | ICD-10-CM | POA: Diagnosis not present

## 2012-10-23 ENCOUNTER — Telehealth (HOSPITAL_COMMUNITY): Payer: Self-pay | Admitting: Physician Assistant

## 2012-10-24 ENCOUNTER — Telehealth (HOSPITAL_COMMUNITY): Payer: Self-pay | Admitting: Physician Assistant

## 2012-10-24 NOTE — Telephone Encounter (Signed)
I want to see if there has been improvement in the pericardial effusion seen on prior echo.  We can change it to a limited echo and just look for it.  Lori Little 2:56 PM

## 2012-10-24 NOTE — Telephone Encounter (Signed)
Returned call.  Left message to call back before 4pm.  

## 2012-10-24 NOTE — Telephone Encounter (Signed)
Pt was wondering if she needed to have the repeat echo you ordered so soon. She says she is feeling better. The last echo was done in 07/2012.

## 2012-10-25 NOTE — Telephone Encounter (Signed)
Returning your call. °

## 2012-10-25 NOTE — Telephone Encounter (Signed)
See telephone note 9.4.14.  Pt advised per PA and verbalized understanding.  Transferred to [facility] in Scheduling.

## 2012-10-25 NOTE — Telephone Encounter (Signed)
Returned call.  No answer. Will await return call from pt.

## 2012-10-31 ENCOUNTER — Other Ambulatory Visit (HOSPITAL_COMMUNITY)
Admission: RE | Admit: 2012-10-31 | Discharge: 2012-10-31 | Disposition: A | Discharge status: Home / Self Care | Payer: Medicare Other | Source: Ambulatory Visit | Attending: Gynecologic Oncology | Admitting: Gynecologic Oncology

## 2012-10-31 ENCOUNTER — Ambulatory Visit: Payer: Medicare Other | Attending: Gynecologic Oncology | Admitting: Gynecologic Oncology

## 2012-10-31 ENCOUNTER — Encounter: Payer: Self-pay | Admitting: Gynecologic Oncology

## 2012-10-31 VITALS — BP 134/79 | HR 86 | Temp 98.8°F | Resp 16 | Ht 63.0 in | Wt 179.5 lb

## 2012-10-31 DIAGNOSIS — C55 Malignant neoplasm of uterus, part unspecified: Secondary | ICD-10-CM

## 2012-10-31 DIAGNOSIS — Z124 Encounter for screening for malignant neoplasm of cervix: Secondary | ICD-10-CM | POA: Insufficient documentation

## 2012-10-31 NOTE — Progress Notes (Signed)
Follow Up Note: Gyn-Onc  [name] 72 y.o. female  CC:  Chief Complaint  Patient presents with  . Uterine Cancer    Follow up    HPI:  Lori Little is a 72 year old female who initially presented in late December 2009 to Dr. Donnetta Hail office with vaginal spotting.  An ultrasound was performed along with a repeat sonohystogram at a return visit in January 2010.  She presented to GYN Oncology in February 2010 as a new patient.  She underwent a total abdominal hysterectomy, bilateral salpingo-oophorectomy, and pelvic/ para-aortic lymphadenectomy in February 2010.  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  There was deep myometrial invasion with the tumor invading the entire endometrial cavity and lower uterine segment.  Furthermore, 2 out of 12 pelvic lymph nodes were positive.  Post-operatively, she received 6 cycles of carboplatin and taxol, which was completed in July 2010.  She has remained clinically free of disease since.       Interval History:  She returns today for continued follow up.  She reports that she has been doing well since her last visit.  She recently had her annual physical exam with Dr. Jacky Kindle with her lab work "good" per patient.  Hgb A1C was 6.0.  She continues to see a reflexologist on a regular basis, which has assisted with the resolution of numbness in both hands except for the 3rd and 4th finger on the left hand.  She denies difficulty buttoning buttons or turning pages in a magazine.  Denies GI or GU symptoms.  Mammogram in May 2014 with normal findings.  Reporting last colonoscopy two to three years ago with Dr. Jacky Kindle managing her preventative care needs.  She has plans to begin a program called [employer], which combines several resistance/weight machines to assist with bone/muscular health.  She plans to have a repeat bone density scan this month and states that her last scan "was not too good."  She continues to take Fosamax.  She  reports having an episode of chest pain that was relieved by Tums in June 2014.  She states that she sought medication attention and was sent to the emergency room.  She had several cardiac tests and states that the episode was thought to be caused by a virus but she has plans to have a repeat echocardiogram as follow up in the next several weeks.  She has not had any episodes of chest pain or shortness of breath since her hospitalization.  She has been increasing her physical activity including walking.  No concerns voiced.  Review of Systems Constitutional: Feels well.  No fever, chills, early satiety, change in weight or appetite.  Able to complete activities of daily living without difficulty.  Skin: No rash, sores, jaundice, itching, dryness.  Cardiovascular: No chest pain, shortness of breath, or edema.  Pulmonary: No cough or wheeze.  Gastro Intestinal: No nausea, vomiting, constipation, or diarrhea reported. No bright red blood per rectum, no abdominal pain, or change in bowel movement.  Genitourinary: No frequency, urgency, or dysuria.  Musculoskeletal: No myalgia, arthralgia, joint swelling or pain. Neurologic: No weakness or change in gait.  Fingertip numbness reported with the 3 rd and 4 th fingers of the left hand but improving with no numbness elsewhere.   Psychology: No depression, anxiety, or insomnia.   Current Meds:  Outpatient Encounter Prescriptions as of 10/31/2012  Medication Sig Dispense Refill  . alendronate (FOSAMAX) 70 MG tablet Take 70 mg by mouth  every 7 (seven) days. Takes on Fridays. Take with a full glass of water on an empty stomach.      Marland Kitchen aspirin EC 81 MG tablet Take 81 mg by mouth daily.      . bisacodyl (DULCOLAX) 5 MG EC tablet Take 5 mg by mouth daily as needed for constipation.      . Calcium Carbonate-Vit D-Min (CALCIUM 1200 PO) Take 1 tablet by mouth 2 (two) times daily.      . Coenzyme Q10 200 MG capsule Take 200 mg by mouth daily.      . fish oil-omega-3  fatty acids 1000 MG capsule Take 1 g by mouth daily.      . Glucosamine-Chondroitin (COSAMIN DS PO) Take 1 tablet by mouth daily. 1500 Glucosamine & 1200 Chondroitin       . GRAPE SEED EXTRACT PO Take 2 tablets by mouth daily.      . Levothyroxine Sodium (SYNTHROID PO) Take 112 mcg by mouth daily.       . metformin (FORTAMET) 500 MG (OSM) 24 hr tablet Take 500 mg by mouth 3 (three) times daily with meals. 3 to 4 times a day      . Multiple Vitamins-Minerals (MULTIVITAMIN PO) Take 2 tablets by mouth daily.      Marland Kitchen olmesartan-hydrochlorothiazide (BENICAR HCT) 40-12.5 MG per tablet Take 1 tablet by mouth daily.      Marland Kitchen omeprazole (PRILOSEC) 20 MG capsule Take 20 mg by mouth daily.      Marland Kitchen pyridOXINE (VITAMIN B-6) 100 MG tablet Take 100 mg by mouth daily.      . Red Yeast Rice 600 MG TABS Take 2 tablets by mouth daily.      . vitamin B-12 (CYANOCOBALAMIN) 1000 MCG tablet Take 1,000 mcg by mouth daily.      . metoprolol tartrate (LOPRESSOR) 25 MG tablet Take 1 tablet (25 mg total) by mouth 2 (two) times daily.  60 tablet  5   No facility-administered encounter medications on file as of 10/31/2012.   Allergy:  Allergies  Allergen Reactions  . Sennosides Hives    Ex- Lax   Social Hx:   History   Social History  . Marital Status: Divorced    Spouse Name: N/A    Number of Children: N/A  . Years of Education: N/A   Occupational History  . Not on file.   Social History Main Topics  . Smoking status: Former Smoker -- 6 years    Quit date: 02/21/1976  . Smokeless tobacco: Never Used  . Alcohol Use: No  . Drug Use: No  . Sexual Activity: Not on file   Other Topics Concern  . Not on file   Social History Narrative  . No narrative on file   Past Surgical Hx:  Past Surgical History  Procedure Laterality Date  . Umbilical hernia repair    . Ganglion cyst excision      Right wrist  . Tonsillectomy      with adenoidectomy  . Abdominal hysterectomy      TAH with BSO, pelvic &  periaortic lymphadenectomy  . Cataract extraction     Past Medical Hx:  Past Medical History  Diagnosis Date  . Uterine cancer     uterine ca dx 2010  . Diabetes mellitus   . Hypertension   . Obesity   . Hypothyroidism   . Plantar fasciitis   . 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 HTN (hypertension) 07/29/2012  . Hypothyroid 07/29/2012  .  Pericardial effusion 07/30/2012  . Tachycardia 07/30/2012   Family Hx:  Family History  Problem Relation Age of Onset  . Lung cancer Mother   . Diabetes Father   . Healthy Sister   . Healthy Sister   . Healthy Sister     Vitals:  Blood pressure 134/79, pulse 86, temperature 98.8 F (37.1 C), temperature source Oral, resp. rate 16, height 5\' 3"  (1.6 m), weight 179 lb 8 oz (81.421 kg).  Physical Exam:  General: Well developed, well nourished female in no acute distress. Alert, oriented x3. Head/Neck:  Oropharynx clear.  Sclerae anicteric.    Cardiovascular: Heart rate and rhythm regular. S1 and S2 normal. No murmurs, clicks, or rubs noted.  Lungs: Clear to auscultation bilaterally. No wheezes, crackles, or rhonchi.  Skin: No rash, lesions, or skin breakdown noted. Lymph:  No subclavicular, axillary, or inguinal adenopathy noted.  Abdomen: Normoactive bowel sounds, abdomen soft, non-tender and obese.  No evidence of organomegaly or abdominal masses.  Pelvic: Vulva: Normal external female genitalia. No lesions.  EGBUS: Normal, No lesions or masses.  Vagina: No visible lesions or palpable vaginal/ pelvic masses. No discharge or bleeding noted.  Cervix and uterus surgically absent.  ThinPrep pap obtained without difficulty.  Rectal: Good rectal tone. No masses. No cul de sac nodularity.  Extremities: Moderate varicosities noted bilaterally with no edema.  No cyanosis.  Assessment/Plan:  72 year old female with Stage IIIC1 carcinosarcoma of the uterus.  Status post surgical resection in February 2010 and adjuvant chemotherapy including carboplatin  and taxol, which was completed in July of 2010.  She is clinically free of disease with plans to follow up with GYN Oncology in 6 months for continued follow up or sooner if needed.  We will contact her with the results of her pap smear from today.  She is advised to call for any questions or concerns.      CROSS, MELISSA DEAL, NP 10/31/2012, 3:20 PM

## 2012-10-31 NOTE — Patient Instructions (Signed)
We will contact you with the results of your pap smear.  Plan to follow up in six months or sooner if needed.  Please call in Nov. Or Dec. 2014 to schedule an appointment in March 2015.

## 2012-11-05 DIAGNOSIS — M899 Disorder of bone, unspecified: Secondary | ICD-10-CM | POA: Diagnosis not present

## 2012-11-06 ENCOUNTER — Telehealth: Payer: Self-pay | Admitting: Gynecologic Oncology

## 2012-11-06 NOTE — Telephone Encounter (Signed)
Pt notified about pap results: negative.  No questions or concerns voiced. 

## 2012-11-15 ENCOUNTER — Ambulatory Visit (HOSPITAL_COMMUNITY): Payer: Medicare Other

## 2012-11-19 ENCOUNTER — Ambulatory Visit (HOSPITAL_COMMUNITY)
Admission: RE | Admit: 2012-11-19 | Discharge: 2012-11-19 | Disposition: A | Discharge status: Home / Self Care | Payer: Medicare Other | Source: Ambulatory Visit | Attending: Cardiovascular Disease | Admitting: Cardiovascular Disease

## 2012-11-19 DIAGNOSIS — I319 Disease of pericardium, unspecified: Secondary | ICD-10-CM | POA: Insufficient documentation

## 2012-11-19 DIAGNOSIS — I059 Rheumatic mitral valve disease, unspecified: Secondary | ICD-10-CM | POA: Diagnosis not present

## 2012-11-19 DIAGNOSIS — I313 Pericardial effusion (noninflammatory): Secondary | ICD-10-CM

## 2012-11-19 NOTE — Progress Notes (Signed)
  Echocardiogram 2D Echocardiogram has been performed.  Lori Little 11/19/2012, 2:34 PM

## 2012-11-21 ENCOUNTER — Telehealth: Payer: Self-pay | Admitting: Physician Assistant

## 2012-11-21 NOTE — Telephone Encounter (Signed)
Has not been reviewed yet, pt notified

## 2012-11-21 NOTE — Telephone Encounter (Signed)
Forwarded to [employer]

## 2012-11-21 NOTE — Telephone Encounter (Signed)
Wanting to get results of echo done 9/30

## 2012-12-18 ENCOUNTER — Telehealth: Payer: Self-pay | Admitting: Cardiovascular Disease

## 2012-12-18 NOTE — Telephone Encounter (Signed)
Calling for results of echo from September  Please call (Ok to leave msg on vm)

## 2012-12-18 NOTE — Telephone Encounter (Signed)
Message forwarded to Dr. Berry/Kathryn, RN. 

## 2012-12-19 NOTE — Telephone Encounter (Signed)
Normal 2-D echo  

## 2012-12-20 NOTE — Telephone Encounter (Signed)
Lm with results that echo was normal.

## 2012-12-26 ENCOUNTER — Other Ambulatory Visit: Payer: Self-pay

## 2013-02-18 DIAGNOSIS — H5200 Hypermetropia, unspecified eye: Secondary | ICD-10-CM | POA: Diagnosis not present

## 2013-02-18 DIAGNOSIS — E119 Type 2 diabetes mellitus without complications: Secondary | ICD-10-CM | POA: Diagnosis not present

## 2013-02-18 DIAGNOSIS — Z961 Presence of intraocular lens: Secondary | ICD-10-CM | POA: Diagnosis not present

## 2013-02-18 DIAGNOSIS — H52229 Regular astigmatism, unspecified eye: Secondary | ICD-10-CM | POA: Diagnosis not present

## 2013-03-19 DIAGNOSIS — Z6831 Body mass index (BMI) 31.0-31.9, adult: Secondary | ICD-10-CM | POA: Diagnosis not present

## 2013-03-19 DIAGNOSIS — E1149 Type 2 diabetes mellitus with other diabetic neurological complication: Secondary | ICD-10-CM | POA: Diagnosis not present

## 2013-03-19 DIAGNOSIS — I10 Essential (primary) hypertension: Secondary | ICD-10-CM | POA: Diagnosis not present

## 2013-05-30 ENCOUNTER — Ambulatory Visit: Payer: Medicare Other | Attending: Gynecologic Oncology | Admitting: Gynecologic Oncology

## 2013-05-30 ENCOUNTER — Other Ambulatory Visit (HOSPITAL_COMMUNITY)
Admission: RE | Admit: 2013-05-30 | Discharge: 2013-05-30 | Disposition: A | Discharge status: Home / Self Care | Payer: Medicare Other | Source: Ambulatory Visit | Attending: Gynecologic Oncology | Admitting: Gynecologic Oncology

## 2013-05-30 ENCOUNTER — Encounter: Payer: Self-pay | Admitting: Gynecologic Oncology

## 2013-05-30 VITALS — BP 114/68 | HR 84 | Temp 98.5°F | Resp 16 | Ht 63.0 in | Wt 193.9 lb

## 2013-05-30 DIAGNOSIS — C55 Malignant neoplasm of uterus, part unspecified: Secondary | ICD-10-CM

## 2013-05-30 DIAGNOSIS — Z124 Encounter for screening for malignant neoplasm of cervix: Secondary | ICD-10-CM | POA: Insufficient documentation

## 2013-05-30 NOTE — Patient Instructions (Signed)
We will contact you with the results of your pap smear from today.  Plan to follow up in one year or sooner if needed.  Please call in Nov or Dec. 2015 to schedule an appointment for April 2016.  Please call for any questions or concerns.

## 2013-05-30 NOTE — Progress Notes (Signed)
Follow Up Note: Gyn-Onc  Lori Little 73 y.o. female  CC:  Chief Complaint  Patient presents with  . Uterine ca    Follow up     HPI:  Lori Little is a 74 year old female who initially presented in late December 2009 to Dr. Verlon Au office with vaginal spotting.  An ultrasound was performed along with a repeat sonohystogram at a return visit in January 2010.  She presented to GYN Oncology in February 2010 as a new patient.  She underwent a total abdominal hysterectomy, bilateral salpingo-oophorectomy, and pelvic/ para-aortic lymphadenectomy in February 2010.  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  There was deep myometrial invasion with the tumor invading the entire endometrial cavity and lower uterine segment.  Furthermore, 2 out of 12 pelvic lymph nodes were positive.  Post-operatively, she received 6 cycles of carboplatin and taxol, which was completed in July 2010.  She has remained clinically free of disease since.       Interval History:  She returns today for continued follow up.  She reports that she has been doing well since her last visit.  She continues to see Dr. Reynaldo Minium with her lab work and HgbA1C "good" per patient.  Numbness in both hands improving.  Denies GI or GU symptoms.  Mammogram due in May 2015.  Reporting being up to date with her colonoscopy with Dr. Reynaldo Minium managing her preventative care needs.  She continues to take Fosamax and reports having an improved bone density scan.  She has been walking 30 minutes on the treadmill daily but reports gaining some weight which she relates to eating more over the winter months.  She has not had any episodes of chest pain or shortness of breath since her hospitalization last year.  No concerns voiced.  Review of Systems Constitutional: Feels well.  No fever, chills, early satiety, change in weight or appetite.  Able to complete activities of daily living without difficulty.  Skin: No rash, sores,  jaundice, itching, dryness.  Cardiovascular: No chest pain, shortness of breath, or edema.  Pulmonary: No cough or wheeze.  Gastro Intestinal: No nausea, vomiting, constipation, or diarrhea reported. No bright red blood per rectum, no abdominal pain, or change in bowel movement.  Genitourinary: No frequency, urgency, or dysuria.  Musculoskeletal: No myalgia, arthralgia, joint swelling or pain. Neurologic: No weakness or change in gait.     Psychology: No depression, anxiety, or insomnia.   Current Meds:  Outpatient Encounter Prescriptions as of 05/30/2013  Medication Sig  . alendronate (FOSAMAX) 70 MG tablet Take 70 mg by mouth every 7 (seven) days. Takes on Fridays. Take with a full glass of water on an empty stomach.  Marland Kitchen aspirin EC 81 MG tablet Take 81 mg by mouth daily.  . bisacodyl (DULCOLAX) 5 MG EC tablet Take 5 mg by mouth daily as needed for constipation.  . Calcium Carbonate-Vit D-Min (CALCIUM 1200 PO) Take 1 tablet by mouth 2 (two) times daily.  . Coenzyme Q10 200 MG capsule Take 200 mg by mouth daily.  . fish oil-omega-3 fatty acids 1000 MG capsule Take 1 g by mouth daily.  . Glucosamine-Chondroitin (COSAMIN DS PO) Take 1 tablet by mouth daily. 1500 Glucosamine & 1200 Chondroitin   . GRAPE SEED EXTRACT PO Take 2 tablets by mouth daily.  . Levothyroxine Sodium (SYNTHROID PO) Take 112 mcg by mouth daily.   . metformin (FORTAMET) 500 MG (OSM) 24 hr tablet Take 500 mg by mouth 3 (  three) times daily with meals. 3 to 4 times a day  . metoprolol tartrate (LOPRESSOR) 25 MG tablet Take 1 tablet (25 mg total) by mouth 2 (two) times daily.  . Multiple Vitamins-Minerals (MULTIVITAMIN PO) Take 2 tablets by mouth daily.  Marland Kitchen olmesartan-hydrochlorothiazide (BENICAR HCT) 40-12.5 MG per tablet Take 1 tablet by mouth daily.  Marland Kitchen omeprazole (PRILOSEC) 20 MG capsule Take 20 mg by mouth daily.  Marland Kitchen pyridOXINE (VITAMIN B-6) 100 MG tablet Take 100 mg by mouth daily.  . Red Yeast Rice 600 MG TABS Take 2  tablets by mouth daily.  . vitamin B-12 (CYANOCOBALAMIN) 1000 MCG tablet Take 1,000 mcg by mouth daily.   Allergy:  Allergies  Allergen Reactions  . Sennosides Hives    Ex- Lax   Social Hx:   History   Social History  . Marital Status: Divorced    Spouse Name: N/A    Number of Children: N/A  . Years of Education: N/A   Occupational History  . Not on file.   Social History Main Topics  . Smoking status: Former Smoker -- 6 years    Quit date: 02/21/1976  . Smokeless tobacco: Never Used  . Alcohol Use: No  . Drug Use: No  . Sexual Activity: Not on file   Other Topics Concern  . Not on file   Social History Narrative  . No narrative on file   Past Surgical Hx:  Past Surgical History  Procedure Laterality Date  . Umbilical hernia repair    . Ganglion cyst excision      Right wrist  . Tonsillectomy      with adenoidectomy  . Abdominal hysterectomy      TAH with BSO, pelvic & periaortic lymphadenectomy  . Cataract extraction     Past Medical Hx:  Past Medical History  Diagnosis Date  . Uterine cancer     uterine ca dx 2010  . Diabetes mellitus   . Hypertension   . Obesity   . Hypothyroidism   . Plantar fasciitis   . 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 HTN (hypertension) 07/29/2012  . Hypothyroid 07/29/2012  . Pericardial effusion 07/30/2012  . Tachycardia 07/30/2012   Family Hx:  Family History  Problem Relation Age of Onset  . Lung cancer Mother   . Diabetes Father   . Healthy Sister   . Healthy Sister   . Healthy Sister     Vitals:  Blood pressure 114/68, pulse 84, temperature 98.5 F (36.9 C), temperature source Oral, resp. rate 16, height 5\' 3"  (1.6 m), weight 193 lb 14.4 oz (87.952 kg).  Physical Exam:  General: Well developed, well nourished female in no acute distress. Alert, oriented x3. Head/Neck:  Oropharynx clear.  Sclerae anicteric.    Cardiovascular: Heart rate and rhythm regular. S1 and S2 normal. No murmurs, clicks, or rubs noted.   Lungs: Clear to auscultation bilaterally. No wheezes, crackles, or rhonchi.  Skin: No rash, lesions, or skin breakdown noted. Lymph:  No subclavicular, axillary, or inguinal adenopathy noted.  Abdomen: Normoactive bowel sounds, abdomen soft, non-tender and obese.  No evidence of organomegaly or abdominal masses.  Pelvic: Vulva: Normal external female genitalia. No lesions.  EGBUS: Normal, No lesions or masses.  Vagina: No visible lesions or palpable vaginal/ pelvic masses. No discharge or bleeding noted.  Cervix and uterus surgically absent.  ThinPrep pap obtained without difficulty.  Rectal: Good rectal tone. No masses. No cul de sac nodularity.  Extremities: Moderate varicosities noted bilaterally with no  edema.  No cyanosis.  Assessment/Plan:  73 year old female with Stage IIIC1 carcinosarcoma of the uterus.  Status post surgical resection in February 2010 and adjuvant chemotherapy including carboplatin and taxol, which was completed in July of 2010.  She is clinically free of disease with plans to follow up with GYN Oncology in 1 year per Dr. Fermin Schwab for continued follow up or sooner if needed.  We will contact her with the results of her pap smear from today.  She is advised to call for any questions or concerns.      Dorothyann Gibbs, NP 05/30/2013, 1:54 PM

## 2013-06-06 ENCOUNTER — Telehealth: Payer: Self-pay | Admitting: *Deleted

## 2013-06-06 NOTE — Telephone Encounter (Signed)
Message copied by Saahir Prude, Aletha Halim on Fri Jun 06, 2013  9:58 AM ------      Message from: CROSS, MELISSA D      Created: Thu Jun 05, 2013 12:57 PM       Please let her know that her pap smear is normal.            ----- Message -----         From: [facility] Interface         Sent: 06/04/2013   5:26 PM           To: Dorothyann Gibbs, NP                   ------

## 2013-06-06 NOTE — Telephone Encounter (Signed)
Notified pt Pap smear results normal. Pt verbalized understanding.

## 2013-06-11 ENCOUNTER — Telehealth: Payer: Self-pay | Admitting: *Deleted

## 2013-06-11 NOTE — Telephone Encounter (Signed)
Would appreciate a call.  Patient came in the office.  She was concerned that her toe was infected.  I looked at it.  It looked like it may be a Plantars Wart.  I told her it is not infected.  I asked her to schedule an appointment for tomorrow because we did not have any availabilities for today.  Patient was sent to a scheduler.

## 2013-06-13 ENCOUNTER — Ambulatory Visit (INDEPENDENT_AMBULATORY_CARE_PROVIDER_SITE_OTHER): Payer: Medicare Other

## 2013-06-13 ENCOUNTER — Ambulatory Visit: Payer: Self-pay

## 2013-06-13 VITALS — BP 137/87 | HR 92 | Resp 12

## 2013-06-13 DIAGNOSIS — Q828 Other specified congenital malformations of skin: Secondary | ICD-10-CM | POA: Diagnosis not present

## 2013-06-13 DIAGNOSIS — E1142 Type 2 diabetes mellitus with diabetic polyneuropathy: Secondary | ICD-10-CM

## 2013-06-13 DIAGNOSIS — E1149 Type 2 diabetes mellitus with other diabetic neurological complication: Secondary | ICD-10-CM

## 2013-06-13 DIAGNOSIS — B070 Plantar wart: Secondary | ICD-10-CM

## 2013-06-13 DIAGNOSIS — E1140 Type 2 diabetes mellitus with diabetic neuropathy, unspecified: Secondary | ICD-10-CM

## 2013-06-13 DIAGNOSIS — M79609 Pain in unspecified limb: Secondary | ICD-10-CM | POA: Diagnosis not present

## 2013-06-13 NOTE — Patient Instructions (Signed)

## 2013-06-13 NOTE — Progress Notes (Signed)
   Subjective:    Patient ID: Lori Little, female    DOB: 12/14/1940, 73 y.o.   MRN: [id number]  HPI PT STATED LT FOOT UNDERNEATH THE  GREAT TOE IS SORE FOR 1 WEEK. THE TOE IS BEEN THE SAME, NO WORSE NO BETTER. TRIED TO USED NEOSPORIN AND LOTION HELP SAME.    Review of Systems  All other systems reviewed and are negative.      Objective:   Physical Exam Okay objective findings as follows vascular status is intact with pedal pulses palpable DP +2/4 bilateral PT plus one over 4 bilateral mild varicosities noted Refill time 3 seconds all digits dermatologically skin color pigment normal hair growth absent nails criptotic there is proximal the 3-4 mm DePuy circumscribed lesion sub-hallux IP joint left great toe plantar aspect of foot painful on direct lateral compression is interruption of skin lines is noted this is consistent with either verruca report keratoses benign neoplasm of skin. 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       Assessment & Plan:  Assessment this time is for versus porokeratosis are benign neoplasm skin plantar left hallux the lesion is debrided pack to 66% salicylic acid under occlusion for 24 hours patient is given instructions to remove after 24 hours cleansed thoroughly with soap and water apply Neosporin Band-Aid followup on an as-needed basis if there's any recurrence or future exacerbations or for a diabetic footcare evaluations in the future as needed next  [employer] DPM

## 2013-07-03 DIAGNOSIS — Z1231 Encounter for screening mammogram for malignant neoplasm of breast: Secondary | ICD-10-CM | POA: Diagnosis not present

## 2013-09-03 DIAGNOSIS — E119 Type 2 diabetes mellitus without complications: Secondary | ICD-10-CM | POA: Diagnosis not present

## 2013-09-03 DIAGNOSIS — Z961 Presence of intraocular lens: Secondary | ICD-10-CM | POA: Diagnosis not present

## 2013-10-22 DIAGNOSIS — E785 Hyperlipidemia, unspecified: Secondary | ICD-10-CM | POA: Diagnosis not present

## 2013-10-22 DIAGNOSIS — M810 Age-related osteoporosis without current pathological fracture: Secondary | ICD-10-CM | POA: Diagnosis not present

## 2013-10-22 DIAGNOSIS — I10 Essential (primary) hypertension: Secondary | ICD-10-CM | POA: Diagnosis not present

## 2013-10-22 DIAGNOSIS — E1149 Type 2 diabetes mellitus with other diabetic neurological complication: Secondary | ICD-10-CM | POA: Diagnosis not present

## 2013-10-24 DIAGNOSIS — Z1212 Encounter for screening for malignant neoplasm of rectum: Secondary | ICD-10-CM | POA: Diagnosis not present

## 2013-10-29 DIAGNOSIS — E1149 Type 2 diabetes mellitus with other diabetic neurological complication: Secondary | ICD-10-CM | POA: Diagnosis not present

## 2013-10-29 DIAGNOSIS — G609 Hereditary and idiopathic neuropathy, unspecified: Secondary | ICD-10-CM | POA: Diagnosis not present

## 2013-10-29 DIAGNOSIS — M1990 Unspecified osteoarthritis, unspecified site: Secondary | ICD-10-CM | POA: Diagnosis not present

## 2013-10-29 DIAGNOSIS — I10 Essential (primary) hypertension: Secondary | ICD-10-CM | POA: Diagnosis not present

## 2013-10-29 DIAGNOSIS — Z1331 Encounter for screening for depression: Secondary | ICD-10-CM | POA: Diagnosis not present

## 2013-10-29 DIAGNOSIS — Z23 Encounter for immunization: Secondary | ICD-10-CM | POA: Diagnosis not present

## 2013-10-29 DIAGNOSIS — Z6831 Body mass index (BMI) 31.0-31.9, adult: Secondary | ICD-10-CM | POA: Diagnosis not present

## 2013-10-29 DIAGNOSIS — Z0000 Encounter for general adult medical examination without abnormal findings: Secondary | ICD-10-CM | POA: Diagnosis not present

## 2013-10-29 DIAGNOSIS — E785 Hyperlipidemia, unspecified: Secondary | ICD-10-CM | POA: Diagnosis not present

## 2013-11-20 DIAGNOSIS — Z23 Encounter for immunization: Secondary | ICD-10-CM | POA: Diagnosis not present

## 2014-01-09 DIAGNOSIS — D2271 Melanocytic nevi of right lower limb, including hip: Secondary | ICD-10-CM | POA: Diagnosis not present

## 2014-01-09 DIAGNOSIS — D225 Melanocytic nevi of trunk: Secondary | ICD-10-CM | POA: Diagnosis not present

## 2014-01-09 DIAGNOSIS — D2261 Melanocytic nevi of right upper limb, including shoulder: Secondary | ICD-10-CM | POA: Diagnosis not present

## 2014-01-09 DIAGNOSIS — D2272 Melanocytic nevi of left lower limb, including hip: Secondary | ICD-10-CM | POA: Diagnosis not present

## 2014-01-09 DIAGNOSIS — D2262 Melanocytic nevi of left upper limb, including shoulder: Secondary | ICD-10-CM | POA: Diagnosis not present

## 2014-01-09 DIAGNOSIS — L918 Other hypertrophic disorders of the skin: Secondary | ICD-10-CM | POA: Diagnosis not present

## 2014-01-09 DIAGNOSIS — L821 Other seborrheic keratosis: Secondary | ICD-10-CM | POA: Diagnosis not present

## 2014-01-09 DIAGNOSIS — D1801 Hemangioma of skin and subcutaneous tissue: Secondary | ICD-10-CM | POA: Diagnosis not present

## 2014-02-05 DIAGNOSIS — H40013 Open angle with borderline findings, low risk, bilateral: Secondary | ICD-10-CM | POA: Diagnosis not present

## 2014-02-05 DIAGNOSIS — H40053 Ocular hypertension, bilateral: Secondary | ICD-10-CM | POA: Diagnosis not present

## 2014-02-05 DIAGNOSIS — E119 Type 2 diabetes mellitus without complications: Secondary | ICD-10-CM | POA: Diagnosis not present

## 2014-02-05 DIAGNOSIS — H40059 Ocular hypertension, unspecified eye: Secondary | ICD-10-CM | POA: Diagnosis not present

## 2014-04-08 DIAGNOSIS — Z1389 Encounter for screening for other disorder: Secondary | ICD-10-CM | POA: Diagnosis not present

## 2014-04-08 DIAGNOSIS — M1990 Unspecified osteoarthritis, unspecified site: Secondary | ICD-10-CM | POA: Diagnosis not present

## 2014-04-08 DIAGNOSIS — M810 Age-related osteoporosis without current pathological fracture: Secondary | ICD-10-CM | POA: Diagnosis not present

## 2014-04-08 DIAGNOSIS — Z6832 Body mass index (BMI) 32.0-32.9, adult: Secondary | ICD-10-CM | POA: Diagnosis not present

## 2014-04-08 DIAGNOSIS — E1149 Type 2 diabetes mellitus with other diabetic neurological complication: Secondary | ICD-10-CM | POA: Diagnosis not present

## 2014-04-08 DIAGNOSIS — I10 Essential (primary) hypertension: Secondary | ICD-10-CM | POA: Diagnosis not present

## 2014-04-08 DIAGNOSIS — E785 Hyperlipidemia, unspecified: Secondary | ICD-10-CM | POA: Diagnosis not present

## 2014-04-08 DIAGNOSIS — G629 Polyneuropathy, unspecified: Secondary | ICD-10-CM | POA: Diagnosis not present

## 2014-06-12 ENCOUNTER — Encounter: Payer: Self-pay | Admitting: Gynecologic Oncology

## 2014-06-12 ENCOUNTER — Ambulatory Visit: Payer: Medicare Other | Attending: Gynecologic Oncology | Admitting: Gynecologic Oncology

## 2014-06-12 VITALS — BP 131/67 | HR 90 | Temp 98.7°F | Resp 16 | Ht 63.0 in | Wt 195.6 lb

## 2014-06-12 DIAGNOSIS — C55 Malignant neoplasm of uterus, part unspecified: Secondary | ICD-10-CM | POA: Diagnosis not present

## 2014-06-12 DIAGNOSIS — Z8542 Personal history of malignant neoplasm of other parts of uterus: Secondary | ICD-10-CM

## 2014-06-12 NOTE — Progress Notes (Signed)
Follow Up Note: Gyn-Onc  Lori Little 74 y.o. female  CC:  Chief Complaint  Patient presents with  . uterine cancer    Follow up    HPI:  Lori Little is a 74 year old female who initially presented in late December 2009 to Dr. Verlon Au office with vaginal spotting.  An ultrasound was performed along with a repeat sonohystogram at a return visit in January 2010.  She presented to GYN Oncology in February 2010 as a new patient.  She underwent a total abdominal hysterectomy, bilateral salpingo-oophorectomy, and pelvic/ para-aortic lymphadenectomy in February 2010.  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  There was deep myometrial invasion with the tumor invading the entire endometrial cavity and lower uterine segment.  Furthermore, 2 out of 12 pelvic lymph nodes were positive.  Post-operatively, she received 6 cycles of carboplatin and taxol, which was completed in July 2010.  She has remained clinically free of disease since.       Interval History:  She returns today for continued follow up.  She reports that she has been doing well since her last visit.  She continues to see Dr. Reynaldo Minium, last in Feb 2016, with her lab work and HgbA1C "great" per patient.  Numbness in both hands has improved to just the finger tips on her left hand, 3rd and 4th digit.  She states she had a nerve study conducted to evaluate the numbness and it was determined she had carpal tunnel.  She has a brace she can wear PRN.  Denies GI or GU symptoms.  Mammogram due in May 2016.  Reporting being up to date with her colonoscopy with Dr. Reynaldo Minium managing her preventative care needs.  She has developed a cough with clear sputum over the past month and contributes it to working outside more.  She was started on antihistamines and cough medication per Dr. Reynaldo Minium with some relief.  No concerns voiced.  Review of Systems Constitutional: Feels well.  No fever, chills, early satiety, change in weight or  appetite.  Able to complete activities of daily living without difficulty.  Skin: No rash, sores, jaundice, itching, dryness.  Cardiovascular: No chest pain, shortness of breath, or edema.  Pulmonary: Cough x 1 month with clear sputum, more at nighttime.  No hemoptysis.  No wheeze.  Gastro Intestinal: No nausea, vomiting, constipation, or diarrhea reported. No bright red blood per rectum, no abdominal pain, or change in bowel movement.  Genitourinary: No frequency, urgency, or dysuria.  Musculoskeletal: No myalgia, arthralgia, joint swelling or pain. Neurologic: No weakness or change in gait.     Psychology: No depression, anxiety, or insomnia.   Current Meds:  Outpatient Encounter Prescriptions as of 06/12/2014  Medication Sig  . alendronate (FOSAMAX) 70 MG tablet Take 70 mg by mouth every 7 (seven) days. Takes on Fridays. Take with a full glass of water on an empty stomach.  Marland Kitchen aspirin EC 81 MG tablet Take 81 mg by mouth daily.  . bisacodyl (DULCOLAX) 5 MG EC tablet Take 5 mg by mouth daily as needed for constipation.  . Calcium Carbonate-Vit D-Min (CALCIUM 1200 PO) Take 1 tablet by mouth 2 (two) times daily.  . Coenzyme Q10 200 MG capsule Take 200 mg by mouth daily.  . fish oil-omega-3 fatty acids 1000 MG capsule Take 1 g by mouth daily.  . Glucosamine-Chondroitin (COSAMIN DS PO) Take 1 tablet by mouth daily. 1500 Glucosamine & 1200 Chondroitin   . GRAPE SEED EXTRACT PO  Take 2 tablets by mouth daily.  Marland Kitchen levothyroxine (SYNTHROID, LEVOTHROID) 100 MCG tablet Take 100 mcg by mouth daily before breakfast.   . metFORMIN (GLUCOPHAGE) 500 MG tablet Take 1,000 mg by mouth 2 (two) times daily with a meal.  . Multiple Vitamins-Minerals (MULTIVITAMIN PO) Take 2 tablets by mouth daily.  Marland Kitchen olmesartan-hydrochlorothiazide (BENICAR HCT) 40-12.5 MG per tablet Take 1 tablet by mouth daily.  Marland Kitchen omeprazole (PRILOSEC) 20 MG capsule Take 20 mg by mouth daily.  Marland Kitchen pyridOXINE (VITAMIN B-6) 100 MG tablet Take 100 mg  by mouth daily.  . Red Yeast Rice 600 MG TABS Take 2 tablets by mouth daily.  . vitamin B-12 (CYANOCOBALAMIN) 1000 MCG tablet Take 1,000 mcg by mouth daily.  . [DISCONTINUED] Levothyroxine Sodium (SYNTHROID PO) Take 112 mcg by mouth daily.   . [DISCONTINUED] metformin (FORTAMET) 500 MG (OSM) 24 hr tablet Take 500 mg by mouth 3 (three) times daily with meals. 3 to 4 times a day  . [DISCONTINUED] metFORMIN (GLUCOPHAGE) 500 MG tablet   . [DISCONTINUED] metoprolol tartrate (LOPRESSOR) 25 MG tablet Take 1 tablet (25 mg total) by mouth 2 (two) times daily.   Allergy:  Allergies  Allergen Reactions  . Sennosides Hives    Ex- Lax   Social Hx:   History   Social History  . Marital Status: Divorced    Spouse Name: N/A  . Number of Children: N/A  . Years of Education: N/A   Occupational History  . Not on file.   Social History Main Topics  . Smoking status: Former Smoker -- 6 years    Quit date: 02/21/1976  . Smokeless tobacco: Never Used  . Alcohol Use: No  . Drug Use: No  . Sexual Activity: Not on file   Other Topics Concern  . Not on file   Social History Narrative   Past Surgical Hx:  Past Surgical History  Procedure Laterality Date  . Umbilical hernia repair    . Ganglion cyst excision      Right wrist  . Tonsillectomy      with adenoidectomy  . Abdominal hysterectomy      TAH with BSO, pelvic & periaortic lymphadenectomy  . Cataract extraction     Past Medical Hx:  Past Medical History  Diagnosis Date  . Uterine cancer     uterine ca dx 2010  . Diabetes mellitus   . Hypertension   . Obesity   . Hypothyroidism   . Plantar fasciitis   . 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 HTN (hypertension) 07/29/2012  . Hypothyroid 07/29/2012  . Pericardial effusion 07/30/2012  . Tachycardia 07/30/2012   Family Hx:  Family History  Problem Relation Age of Onset  . Lung cancer Mother   . Diabetes Father   . Healthy Sister   . Healthy Sister   . Healthy Sister      Vitals:  Blood pressure 131/67, pulse 90, temperature 98.7 F (37.1 C), temperature source Oral, resp. rate 16, height 5\' 3"  (1.6 m), weight 195 lb 9.6 oz (88.724 kg).  Physical Exam:  General: Well developed, well nourished female in no acute distress. Alert, oriented x3. Head/Neck:  Neck supple.  Oropharynx clear.  Sclerae anicteric.    Cardiovascular: Heart rate and rhythm regular. S1 and S2 normal. No murmurs, clicks, or rubs noted.  Lungs: Clear to auscultation bilaterally. No wheezes, crackles, or rhonchi.  Skin: Healing abrasion to left lower leg.  No rash, lesions, or skin breakdown noted. Lymph:  No subclavicular,  axillary, or inguinal adenopathy noted.  Breasts: Inspection negative with no nodularity, masses, erythema, or discharge noted bilaterally. Abdomen: Normoactive bowel sounds, abdomen soft, non-tender and obese.  No evidence of organomegaly or abdominal masses.  Pelvic: Vulva: Normal external female genitalia. No lesions.  EGBUS: Normal, No lesions or masses.  Vagina: No visible lesions or palpable vaginal/ pelvic masses. No discharge or bleeding noted.  Cervix and uterus surgically absent.   Rectal: Good rectal tone. No masses. No cul de sac nodularity.  Extremities: Moderate varicosities noted bilaterally with no edema.  No cyanosis.  Assessment/Plan:  74 year old female with Stage IIIC1 carcinosarcoma of the uterus.  Status post surgical resection in February 2010 and adjuvant chemotherapy including carboplatin and taxol, which was completed in July of 2010.  She is clinically free of disease with plans to follow up with GYN Oncology in 1 year per Dr. Fermin Schwab for continued follow up or sooner if needed.  Reportable signs and symptoms reviewed.  She is to call our office or Dr. Jacquiline Doe office if the cough persists over the next two to three weeks.  She is advised to call for any questions or concerns.      CROSS, MELISSA DEAL, NP 06/12/2014, 1:46 PM

## 2014-06-12 NOTE — Patient Instructions (Signed)
Doing great!  Plan to follow up in one year or sooner if needed.  Call for the development of any new symptoms or for any questions/concerns.

## 2014-07-07 DIAGNOSIS — Z1231 Encounter for screening mammogram for malignant neoplasm of breast: Secondary | ICD-10-CM | POA: Diagnosis not present

## 2014-08-06 DIAGNOSIS — H40019 Open angle with borderline findings, low risk, unspecified eye: Secondary | ICD-10-CM | POA: Diagnosis not present

## 2014-08-06 DIAGNOSIS — H40059 Ocular hypertension, unspecified eye: Secondary | ICD-10-CM | POA: Diagnosis not present

## 2014-08-07 DIAGNOSIS — E1149 Type 2 diabetes mellitus with other diabetic neurological complication: Secondary | ICD-10-CM | POA: Diagnosis not present

## 2014-08-07 DIAGNOSIS — M810 Age-related osteoporosis without current pathological fracture: Secondary | ICD-10-CM | POA: Diagnosis not present

## 2014-08-07 DIAGNOSIS — Z6832 Body mass index (BMI) 32.0-32.9, adult: Secondary | ICD-10-CM | POA: Diagnosis not present

## 2014-08-07 DIAGNOSIS — E785 Hyperlipidemia, unspecified: Secondary | ICD-10-CM | POA: Diagnosis not present

## 2014-08-07 DIAGNOSIS — G629 Polyneuropathy, unspecified: Secondary | ICD-10-CM | POA: Diagnosis not present

## 2014-08-07 DIAGNOSIS — M1990 Unspecified osteoarthritis, unspecified site: Secondary | ICD-10-CM | POA: Diagnosis not present

## 2014-08-07 DIAGNOSIS — K219 Gastro-esophageal reflux disease without esophagitis: Secondary | ICD-10-CM | POA: Diagnosis not present

## 2014-08-07 DIAGNOSIS — I10 Essential (primary) hypertension: Secondary | ICD-10-CM | POA: Diagnosis not present

## 2014-08-17 ENCOUNTER — Other Ambulatory Visit: Payer: Self-pay

## 2014-09-10 DIAGNOSIS — H9312 Tinnitus, left ear: Secondary | ICD-10-CM | POA: Diagnosis not present

## 2014-09-10 DIAGNOSIS — H903 Sensorineural hearing loss, bilateral: Secondary | ICD-10-CM | POA: Diagnosis not present

## 2014-10-16 DIAGNOSIS — Z6829 Body mass index (BMI) 29.0-29.9, adult: Secondary | ICD-10-CM | POA: Diagnosis not present

## 2014-10-16 DIAGNOSIS — L0889 Other specified local infections of the skin and subcutaneous tissue: Secondary | ICD-10-CM | POA: Diagnosis not present

## 2014-10-16 DIAGNOSIS — I10 Essential (primary) hypertension: Secondary | ICD-10-CM | POA: Diagnosis not present

## 2014-10-16 DIAGNOSIS — E1149 Type 2 diabetes mellitus with other diabetic neurological complication: Secondary | ICD-10-CM | POA: Diagnosis not present

## 2014-12-02 DIAGNOSIS — I10 Essential (primary) hypertension: Secondary | ICD-10-CM | POA: Diagnosis not present

## 2014-12-02 DIAGNOSIS — M810 Age-related osteoporosis without current pathological fracture: Secondary | ICD-10-CM | POA: Diagnosis not present

## 2014-12-02 DIAGNOSIS — R8299 Other abnormal findings in urine: Secondary | ICD-10-CM | POA: Diagnosis not present

## 2014-12-02 DIAGNOSIS — E1149 Type 2 diabetes mellitus with other diabetic neurological complication: Secondary | ICD-10-CM | POA: Diagnosis not present

## 2014-12-02 DIAGNOSIS — N390 Urinary tract infection, site not specified: Secondary | ICD-10-CM | POA: Diagnosis not present

## 2014-12-02 DIAGNOSIS — E785 Hyperlipidemia, unspecified: Secondary | ICD-10-CM | POA: Diagnosis not present

## 2014-12-09 DIAGNOSIS — G629 Polyneuropathy, unspecified: Secondary | ICD-10-CM | POA: Diagnosis not present

## 2014-12-09 DIAGNOSIS — I10 Essential (primary) hypertension: Secondary | ICD-10-CM | POA: Diagnosis not present

## 2014-12-09 DIAGNOSIS — Z6829 Body mass index (BMI) 29.0-29.9, adult: Secondary | ICD-10-CM | POA: Diagnosis not present

## 2014-12-09 DIAGNOSIS — M810 Age-related osteoporosis without current pathological fracture: Secondary | ICD-10-CM | POA: Diagnosis not present

## 2014-12-09 DIAGNOSIS — E1149 Type 2 diabetes mellitus with other diabetic neurological complication: Secondary | ICD-10-CM | POA: Diagnosis not present

## 2014-12-09 DIAGNOSIS — K219 Gastro-esophageal reflux disease without esophagitis: Secondary | ICD-10-CM | POA: Diagnosis not present

## 2014-12-09 DIAGNOSIS — E785 Hyperlipidemia, unspecified: Secondary | ICD-10-CM | POA: Diagnosis not present

## 2014-12-09 DIAGNOSIS — Z23 Encounter for immunization: Secondary | ICD-10-CM | POA: Diagnosis not present

## 2014-12-09 DIAGNOSIS — Z0000 Encounter for general adult medical examination without abnormal findings: Secondary | ICD-10-CM | POA: Diagnosis not present

## 2014-12-14 DIAGNOSIS — Z1212 Encounter for screening for malignant neoplasm of rectum: Secondary | ICD-10-CM | POA: Diagnosis not present

## 2014-12-23 DIAGNOSIS — G5602 Carpal tunnel syndrome, left upper limb: Secondary | ICD-10-CM | POA: Diagnosis not present

## 2015-02-03 DIAGNOSIS — H26493 Other secondary cataract, bilateral: Secondary | ICD-10-CM | POA: Diagnosis not present

## 2015-02-03 DIAGNOSIS — H5203 Hypermetropia, bilateral: Secondary | ICD-10-CM | POA: Diagnosis not present

## 2015-02-03 DIAGNOSIS — Z961 Presence of intraocular lens: Secondary | ICD-10-CM | POA: Diagnosis not present

## 2015-02-03 DIAGNOSIS — E0590 Thyrotoxicosis, unspecified without thyrotoxic crisis or storm: Secondary | ICD-10-CM | POA: Diagnosis not present

## 2015-02-03 DIAGNOSIS — Z7984 Long term (current) use of oral hypoglycemic drugs: Secondary | ICD-10-CM | POA: Diagnosis not present

## 2015-02-03 DIAGNOSIS — H52223 Regular astigmatism, bilateral: Secondary | ICD-10-CM | POA: Diagnosis not present

## 2015-02-03 DIAGNOSIS — H0520 Unspecified exophthalmos: Secondary | ICD-10-CM | POA: Diagnosis not present

## 2015-02-03 DIAGNOSIS — E119 Type 2 diabetes mellitus without complications: Secondary | ICD-10-CM | POA: Diagnosis not present

## 2015-02-03 DIAGNOSIS — H524 Presbyopia: Secondary | ICD-10-CM | POA: Diagnosis not present

## 2015-07-14 DIAGNOSIS — Z1231 Encounter for screening mammogram for malignant neoplasm of breast: Secondary | ICD-10-CM | POA: Diagnosis not present

## 2015-08-04 ENCOUNTER — Encounter: Payer: Self-pay | Admitting: Gynecologic Oncology

## 2015-08-04 ENCOUNTER — Ambulatory Visit: Payer: Medicare Other | Attending: Gynecologic Oncology | Admitting: Gynecologic Oncology

## 2015-08-04 VITALS — BP 120/65 | HR 89 | Temp 99.2°F | Resp 18 | Ht 63.0 in | Wt 182.8 lb

## 2015-08-04 DIAGNOSIS — E039 Hypothyroidism, unspecified: Secondary | ICD-10-CM | POA: Diagnosis not present

## 2015-08-04 DIAGNOSIS — E119 Type 2 diabetes mellitus without complications: Secondary | ICD-10-CM | POA: Diagnosis not present

## 2015-08-04 DIAGNOSIS — Z7982 Long term (current) use of aspirin: Secondary | ICD-10-CM | POA: Diagnosis not present

## 2015-08-04 DIAGNOSIS — I10 Essential (primary) hypertension: Secondary | ICD-10-CM | POA: Insufficient documentation

## 2015-08-04 DIAGNOSIS — Z90710 Acquired absence of both cervix and uterus: Secondary | ICD-10-CM | POA: Insufficient documentation

## 2015-08-04 DIAGNOSIS — G5603 Carpal tunnel syndrome, bilateral upper limbs: Secondary | ICD-10-CM | POA: Diagnosis not present

## 2015-08-04 DIAGNOSIS — E669 Obesity, unspecified: Secondary | ICD-10-CM | POA: Insufficient documentation

## 2015-08-04 DIAGNOSIS — Z9849 Cataract extraction status, unspecified eye: Secondary | ICD-10-CM | POA: Diagnosis not present

## 2015-08-04 DIAGNOSIS — Z833 Family history of diabetes mellitus: Secondary | ICD-10-CM | POA: Diagnosis not present

## 2015-08-04 DIAGNOSIS — Z79899 Other long term (current) drug therapy: Secondary | ICD-10-CM | POA: Diagnosis not present

## 2015-08-04 DIAGNOSIS — C55 Malignant neoplasm of uterus, part unspecified: Secondary | ICD-10-CM | POA: Diagnosis not present

## 2015-08-04 DIAGNOSIS — Z87891 Personal history of nicotine dependence: Secondary | ICD-10-CM | POA: Diagnosis not present

## 2015-08-04 DIAGNOSIS — Z8542 Personal history of malignant neoplasm of other parts of uterus: Secondary | ICD-10-CM | POA: Diagnosis not present

## 2015-08-04 DIAGNOSIS — Z7984 Long term (current) use of oral hypoglycemic drugs: Secondary | ICD-10-CM | POA: Insufficient documentation

## 2015-08-04 DIAGNOSIS — Z9221 Personal history of antineoplastic chemotherapy: Secondary | ICD-10-CM

## 2015-08-04 DIAGNOSIS — Z801 Family history of malignant neoplasm of trachea, bronchus and lung: Secondary | ICD-10-CM | POA: Insufficient documentation

## 2015-08-04 DIAGNOSIS — Z888 Allergy status to other drugs, medicaments and biological substances status: Secondary | ICD-10-CM | POA: Insufficient documentation

## 2015-08-04 NOTE — Progress Notes (Signed)
Follow Up Note: Gyn-Onc  Lori Little 75 y.o. female  CC:  Chief Complaint  Patient presents with  . Uterine Cancer    Follow up    HPI:  Lori Little is a 75 year old female who initially presented in late December 2009 to Dr. Verlon Au office with vaginal spotting.  An ultrasound was performed along with a repeat sonohystogram at a return visit in January 2010.  She presented to GYN Oncology in February 2010 as a new patient.  She underwent a total abdominal hysterectomy, bilateral salpingo-oophorectomy, and pelvic/ para-aortic lymphadenectomy in February 2010.  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  There was deep myometrial invasion with the tumor invading the entire endometrial cavity and lower uterine segment.  Furthermore, 2 out of 12 pelvic lymph nodes were positive.  Post-operatively, she received 6 cycles of carboplatin and taxol, which was completed in July 2010.  She has remained clinically free of disease since.       Interval History:  She returns today for continued follow up.  She reports that she has been doing well since her last visit with no new medical problems.  She has [place] with [place] in the near future.  She continues to see Dr. Reynaldo Minium and recently had her physical with a clear chest xray with hemoglobin A1C "looking good."  The numbness in her left hand, 3rd and 4th digit, has remained stable.  She continues to wear bilateral braces for carpal tunnel at bedtime and would like to avoid surgery.  Denies GI or GU symptoms.  Mammogram up to date from May.  Remains up to date with her colonoscopy with Dr. Reynaldo Minium managing her preventative care needs.  No concerns voiced.  Review of Systems Constitutional: Feels well.  No fever, chills, early satiety, change in weight or appetite.  Able to complete activities of daily living without difficulty.  Skin: No rash, sores, jaundice, itching, dryness.  Cardiovascular: No  chest pain, shortness of breath, or edema.  Pulmonary: No cough.  No hemoptysis.  No wheeze.  Gastro Intestinal: No nausea, vomiting, constipation, or diarrhea reported. No bright red blood per rectum, no abdominal pain, or change in bowel movement.  Genitourinary: No frequency, urgency, or dysuria.  Musculoskeletal: No myalgia, arthralgia, joint swelling or pain. Neurologic: No weakness or change in gait.     Psychology: No depression, anxiety, or insomnia.   Current Meds:  Outpatient Encounter Prescriptions as of 08/04/2015  Medication Sig  . alendronate (FOSAMAX) 70 MG tablet Take 70 mg by mouth every 7 (seven) days. Takes on Fridays. Take with a full glass of water on an empty stomach.  Marland Kitchen aspirin EC 81 MG tablet Take 81 mg by mouth daily.  . bisacodyl (DULCOLAX) 5 MG EC tablet Take 5 mg by mouth daily as needed for constipation.  . Calcium Carbonate-Vit D-Min (CALCIUM 1200 PO) Take 1 tablet by mouth 2 (two) times daily.  . Coenzyme Q10 200 MG capsule Take 200 mg by mouth daily.  . fish oil-omega-3 fatty acids 1000 MG capsule Take 1 g by mouth daily.  . Glucosamine-Chondroitin (COSAMIN DS PO) Take 1 tablet by mouth daily. 1500 Glucosamine & 1200 Chondroitin   . GRAPE SEED EXTRACT PO Take 2 tablets by mouth daily.  Marland Kitchen levothyroxine (SYNTHROID, LEVOTHROID) 100 MCG tablet Take 100 mcg by mouth daily before breakfast.   . metFORMIN (GLUCOPHAGE) 500 MG tablet Take 1,000 mg by mouth 2 (two) times  daily with a meal.  . Multiple Vitamins-Minerals (MULTIVITAMIN PO) Take 2 tablets by mouth daily.  Marland Kitchen olmesartan-hydrochlorothiazide (BENICAR HCT) 40-12.5 MG per tablet Take 1 tablet by mouth daily.  Marland Kitchen omeprazole (PRILOSEC) 20 MG capsule Take 20 mg by mouth daily.  Marland Kitchen pyridOXINE (VITAMIN B-6) 100 MG tablet Take 100 mg by mouth daily.  . Red Yeast Rice 600 MG TABS Take 2 tablets by mouth daily.  . vitamin B-12 (CYANOCOBALAMIN) 1000 MCG tablet Take 1,000 mcg by mouth daily.   No facility-administered  encounter medications on file as of 08/04/2015.   Allergy:  Allergies  Allergen Reactions  . Sennosides Hives    Ex- Lax   Social Hx:   Social History   Social History  . Marital Status: Divorced    Spouse Name: N/A  . Number of Children: N/A  . Years of Education: N/A   Occupational History  . Not on file.   Social History Main Topics  . Smoking status: Former Smoker -- 6 years    Quit date: 02/21/1976  . Smokeless tobacco: Never Used  . Alcohol Use: No  . Drug Use: No  . Sexual Activity: Not on file   Other Topics Concern  . Not on file   Social History Narrative   Past Surgical Hx:  Past Surgical History  Procedure Laterality Date  . Umbilical hernia repair    . Ganglion cyst excision      Right wrist  . Tonsillectomy      with adenoidectomy  . Abdominal hysterectomy      TAH with BSO, pelvic & periaortic lymphadenectomy  . Cataract extraction     Past Medical Hx:  Past Medical History  Diagnosis Date  . Uterine cancer ([place])     uterine ca dx 2010  . Diabetes mellitus   . Hypertension   . Obesity   . Hypothyroidism   . Plantar fasciitis   . Carcinosarcoma of uterus ([place]) 04/22/2012  . HTN (hypertension) 07/29/2012  . Hypothyroid 07/29/2012  . Pericardial effusion 07/30/2012  . Tachycardia 07/30/2012   Family Hx:  Family History  Problem Relation Age of Onset  . Lung cancer Mother   . Diabetes Father   . Healthy Sister   . Healthy Sister   . Healthy Sister     Vitals:  Blood pressure 120/65, pulse 89, temperature 99.2 F (37.3 C), temperature source Oral, resp. rate 18, height 5\' 3"  (1.6 m), weight 182 lb 12.8 oz (82.918 kg), SpO2 98 %.  Physical Exam:  General: Well developed, well nourished female in no acute distress. Alert, oriented x3. Head/Neck:  Neck supple.  Oropharynx clear.  Sclerae anicteric.    Cardiovascular: Heart rate and rhythm regular. S1 and S2 normal. No murmurs, clicks, or rubs noted.  Lungs: Clear to auscultation bilaterally.  No wheezes, crackles, or rhonchi.  Skin: No rash, lesions, or skin breakdown noted. Lymph:  No subclavicular, axillary, or inguinal adenopathy noted.  Breasts: Deferred per pt since normal mammogram in May. Abdomen: Normoactive bowel sounds, abdomen soft, non-tender and obese.  No evidence of organomegaly or abdominal masses.  Pelvic: Vulva: Normal external female genitalia. No lesions.  EGBUS: Normal, No lesions or masses.  Vagina: No visible lesions or palpable vaginal/ pelvic masses. No discharge or bleeding noted.  Cervix and uterus surgically absent.   Rectal: Good rectal tone. No masses. No cul de sac nodularity.  Extremities: Moderate varicosities noted bilaterally with no edema.  No cyanosis.  Assessment/Plan:  75 year old  female with Stage IIIC1 carcinosarcoma of the uterus.  Status post surgical resection in February 2010 and adjuvant chemotherapy including carboplatin and taxol, which was completed in July of 2010.  She is clinically free of disease with plans to follow up with GYN Oncology in 1 year per Dr. Fermin Schwab for continued follow up or sooner if needed.  Reportable signs and symptoms reviewed. She is advised to call for any questions or concerns or new symptoms.      Lori Little DEAL, NP 08/04/2015, 1:22 PM

## 2015-08-04 NOTE — Patient Instructions (Signed)
Plan to follow up in one year or sooner if needed.  Please call our office in November or December 2017 to schedule an appt for June 2018.  Please call for any questions, concerns, or new symptoms.

## 2015-10-20 DIAGNOSIS — S134XXA Sprain of ligaments of cervical spine, initial encounter: Secondary | ICD-10-CM | POA: Diagnosis not present

## 2015-10-20 DIAGNOSIS — S80212A Abrasion, left knee, initial encounter: Secondary | ICD-10-CM | POA: Diagnosis not present

## 2015-10-20 DIAGNOSIS — M62838 Other muscle spasm: Secondary | ICD-10-CM | POA: Diagnosis not present

## 2015-10-20 DIAGNOSIS — M542 Cervicalgia: Secondary | ICD-10-CM | POA: Diagnosis not present

## 2015-10-22 DIAGNOSIS — T148 Other injury of unspecified body region: Secondary | ICD-10-CM | POA: Diagnosis not present

## 2015-12-03 DIAGNOSIS — D1801 Hemangioma of skin and subcutaneous tissue: Secondary | ICD-10-CM | POA: Diagnosis not present

## 2015-12-03 DIAGNOSIS — L814 Other melanin hyperpigmentation: Secondary | ICD-10-CM | POA: Diagnosis not present

## 2015-12-03 DIAGNOSIS — L821 Other seborrheic keratosis: Secondary | ICD-10-CM | POA: Diagnosis not present

## 2015-12-03 DIAGNOSIS — L812 Freckles: Secondary | ICD-10-CM | POA: Diagnosis not present

## 2015-12-08 DIAGNOSIS — N390 Urinary tract infection, site not specified: Secondary | ICD-10-CM | POA: Diagnosis not present

## 2015-12-08 DIAGNOSIS — R8299 Other abnormal findings in urine: Secondary | ICD-10-CM | POA: Diagnosis not present

## 2015-12-08 DIAGNOSIS — M810 Age-related osteoporosis without current pathological fracture: Secondary | ICD-10-CM | POA: Diagnosis not present

## 2015-12-08 DIAGNOSIS — I10 Essential (primary) hypertension: Secondary | ICD-10-CM | POA: Diagnosis not present

## 2015-12-08 DIAGNOSIS — E784 Other hyperlipidemia: Secondary | ICD-10-CM | POA: Diagnosis not present

## 2015-12-08 DIAGNOSIS — E1149 Type 2 diabetes mellitus with other diabetic neurological complication: Secondary | ICD-10-CM | POA: Diagnosis not present

## 2015-12-15 DIAGNOSIS — R079 Chest pain, unspecified: Secondary | ICD-10-CM | POA: Diagnosis not present

## 2015-12-15 DIAGNOSIS — I10 Essential (primary) hypertension: Secondary | ICD-10-CM | POA: Diagnosis not present

## 2015-12-15 DIAGNOSIS — G629 Polyneuropathy, unspecified: Secondary | ICD-10-CM | POA: Diagnosis not present

## 2015-12-15 DIAGNOSIS — Z23 Encounter for immunization: Secondary | ICD-10-CM | POA: Diagnosis not present

## 2015-12-15 DIAGNOSIS — C55 Malignant neoplasm of uterus, part unspecified: Secondary | ICD-10-CM | POA: Diagnosis not present

## 2015-12-15 DIAGNOSIS — M1990 Unspecified osteoarthritis, unspecified site: Secondary | ICD-10-CM | POA: Diagnosis not present

## 2015-12-15 DIAGNOSIS — Z6830 Body mass index (BMI) 30.0-30.9, adult: Secondary | ICD-10-CM | POA: Diagnosis not present

## 2015-12-15 DIAGNOSIS — M810 Age-related osteoporosis without current pathological fracture: Secondary | ICD-10-CM | POA: Diagnosis not present

## 2015-12-15 DIAGNOSIS — Z0000 Encounter for general adult medical examination without abnormal findings: Secondary | ICD-10-CM | POA: Diagnosis not present

## 2015-12-15 DIAGNOSIS — Z1389 Encounter for screening for other disorder: Secondary | ICD-10-CM | POA: Diagnosis not present

## 2015-12-15 DIAGNOSIS — E784 Other hyperlipidemia: Secondary | ICD-10-CM | POA: Diagnosis not present

## 2015-12-15 DIAGNOSIS — E1149 Type 2 diabetes mellitus with other diabetic neurological complication: Secondary | ICD-10-CM | POA: Diagnosis not present

## 2015-12-22 DIAGNOSIS — Z1212 Encounter for screening for malignant neoplasm of rectum: Secondary | ICD-10-CM | POA: Diagnosis not present

## 2016-01-19 DIAGNOSIS — H40053 Ocular hypertension, bilateral: Secondary | ICD-10-CM | POA: Diagnosis not present

## 2016-01-19 DIAGNOSIS — H5203 Hypermetropia, bilateral: Secondary | ICD-10-CM | POA: Diagnosis not present

## 2016-01-19 DIAGNOSIS — E119 Type 2 diabetes mellitus without complications: Secondary | ICD-10-CM | POA: Diagnosis not present

## 2016-01-19 DIAGNOSIS — Z961 Presence of intraocular lens: Secondary | ICD-10-CM | POA: Diagnosis not present

## 2016-01-19 DIAGNOSIS — H26491 Other secondary cataract, right eye: Secondary | ICD-10-CM | POA: Diagnosis not present

## 2016-01-19 DIAGNOSIS — H52223 Regular astigmatism, bilateral: Secondary | ICD-10-CM | POA: Diagnosis not present

## 2016-01-26 DIAGNOSIS — R21 Rash and other nonspecific skin eruption: Secondary | ICD-10-CM | POA: Diagnosis not present

## 2016-01-26 DIAGNOSIS — Z6829 Body mass index (BMI) 29.0-29.9, adult: Secondary | ICD-10-CM | POA: Diagnosis not present

## 2016-02-04 DIAGNOSIS — S70211S Abrasion, right hip, sequela: Secondary | ICD-10-CM | POA: Diagnosis not present

## 2016-02-04 DIAGNOSIS — Z6829 Body mass index (BMI) 29.0-29.9, adult: Secondary | ICD-10-CM | POA: Diagnosis not present

## 2016-02-04 DIAGNOSIS — Z5189 Encounter for other specified aftercare: Secondary | ICD-10-CM | POA: Diagnosis not present

## 2016-02-09 DIAGNOSIS — Z5189 Encounter for other specified aftercare: Secondary | ICD-10-CM | POA: Diagnosis not present

## 2016-02-09 DIAGNOSIS — S70211S Abrasion, right hip, sequela: Secondary | ICD-10-CM | POA: Diagnosis not present

## 2016-02-16 DIAGNOSIS — Z6830 Body mass index (BMI) 30.0-30.9, adult: Secondary | ICD-10-CM | POA: Diagnosis not present

## 2016-02-16 DIAGNOSIS — Z5189 Encounter for other specified aftercare: Secondary | ICD-10-CM | POA: Diagnosis not present

## 2016-02-16 DIAGNOSIS — S70211S Abrasion, right hip, sequela: Secondary | ICD-10-CM | POA: Diagnosis not present

## 2016-05-12 DIAGNOSIS — H26491 Other secondary cataract, right eye: Secondary | ICD-10-CM | POA: Diagnosis not present

## 2016-05-12 DIAGNOSIS — H52221 Regular astigmatism, right eye: Secondary | ICD-10-CM | POA: Diagnosis not present

## 2016-05-12 DIAGNOSIS — H40053 Ocular hypertension, bilateral: Secondary | ICD-10-CM | POA: Diagnosis not present

## 2016-05-12 DIAGNOSIS — H5201 Hypermetropia, right eye: Secondary | ICD-10-CM | POA: Diagnosis not present

## 2016-05-12 DIAGNOSIS — Z961 Presence of intraocular lens: Secondary | ICD-10-CM | POA: Diagnosis not present

## 2016-06-12 DIAGNOSIS — Z1389 Encounter for screening for other disorder: Secondary | ICD-10-CM | POA: Diagnosis not present

## 2016-06-12 DIAGNOSIS — M1990 Unspecified osteoarthritis, unspecified site: Secondary | ICD-10-CM | POA: Diagnosis not present

## 2016-06-12 DIAGNOSIS — M810 Age-related osteoporosis without current pathological fracture: Secondary | ICD-10-CM | POA: Diagnosis not present

## 2016-06-12 DIAGNOSIS — E784 Other hyperlipidemia: Secondary | ICD-10-CM | POA: Diagnosis not present

## 2016-06-12 DIAGNOSIS — R079 Chest pain, unspecified: Secondary | ICD-10-CM | POA: Diagnosis not present

## 2016-06-12 DIAGNOSIS — K219 Gastro-esophageal reflux disease without esophagitis: Secondary | ICD-10-CM | POA: Diagnosis not present

## 2016-06-12 DIAGNOSIS — Z6830 Body mass index (BMI) 30.0-30.9, adult: Secondary | ICD-10-CM | POA: Diagnosis not present

## 2016-06-12 DIAGNOSIS — C55 Malignant neoplasm of uterus, part unspecified: Secondary | ICD-10-CM | POA: Diagnosis not present

## 2016-06-12 DIAGNOSIS — G629 Polyneuropathy, unspecified: Secondary | ICD-10-CM | POA: Diagnosis not present

## 2016-06-12 DIAGNOSIS — E1149 Type 2 diabetes mellitus with other diabetic neurological complication: Secondary | ICD-10-CM | POA: Diagnosis not present

## 2016-06-12 DIAGNOSIS — I10 Essential (primary) hypertension: Secondary | ICD-10-CM | POA: Diagnosis not present

## 2016-07-12 DIAGNOSIS — Z1231 Encounter for screening mammogram for malignant neoplasm of breast: Secondary | ICD-10-CM | POA: Diagnosis not present

## 2016-08-02 ENCOUNTER — Telehealth: Payer: Self-pay | Admitting: *Deleted

## 2016-08-02 NOTE — Telephone Encounter (Addendum)
Contacted the patient and scheduled follow up appt for July 10th. Patient aware and will arrive at 10:45am

## 2016-08-29 ENCOUNTER — Encounter: Payer: Self-pay | Admitting: Gynecologic Oncology

## 2016-08-29 ENCOUNTER — Ambulatory Visit: Payer: Medicare Other | Attending: Gynecologic Oncology | Admitting: Gynecologic Oncology

## 2016-08-29 ENCOUNTER — Telehealth: Payer: Self-pay

## 2016-08-29 VITALS — BP 125/57 | HR 73 | Temp 98.2°F | Resp 20 | Wt 189.3 lb

## 2016-08-29 DIAGNOSIS — Z8542 Personal history of malignant neoplasm of other parts of uterus: Secondary | ICD-10-CM | POA: Diagnosis not present

## 2016-08-29 DIAGNOSIS — Z08 Encounter for follow-up examination after completed treatment for malignant neoplasm: Secondary | ICD-10-CM | POA: Insufficient documentation

## 2016-08-29 DIAGNOSIS — Z7982 Long term (current) use of aspirin: Secondary | ICD-10-CM | POA: Diagnosis not present

## 2016-08-29 DIAGNOSIS — Z9221 Personal history of antineoplastic chemotherapy: Secondary | ICD-10-CM | POA: Diagnosis not present

## 2016-08-29 DIAGNOSIS — Z7984 Long term (current) use of oral hypoglycemic drugs: Secondary | ICD-10-CM | POA: Insufficient documentation

## 2016-08-29 DIAGNOSIS — E669 Obesity, unspecified: Secondary | ICD-10-CM | POA: Diagnosis not present

## 2016-08-29 DIAGNOSIS — E119 Type 2 diabetes mellitus without complications: Secondary | ICD-10-CM | POA: Diagnosis not present

## 2016-08-29 DIAGNOSIS — Z79899 Other long term (current) drug therapy: Secondary | ICD-10-CM | POA: Insufficient documentation

## 2016-08-29 DIAGNOSIS — C55 Malignant neoplasm of uterus, part unspecified: Secondary | ICD-10-CM

## 2016-08-29 DIAGNOSIS — E039 Hypothyroidism, unspecified: Secondary | ICD-10-CM | POA: Insufficient documentation

## 2016-08-29 DIAGNOSIS — Z6833 Body mass index (BMI) 33.0-33.9, adult: Secondary | ICD-10-CM | POA: Diagnosis not present

## 2016-08-29 DIAGNOSIS — I10 Essential (primary) hypertension: Secondary | ICD-10-CM | POA: Diagnosis not present

## 2016-08-29 DIAGNOSIS — Z87891 Personal history of nicotine dependence: Secondary | ICD-10-CM | POA: Insufficient documentation

## 2016-08-29 NOTE — Telephone Encounter (Signed)
ENCOUNTER OPENED IN ERROR

## 2016-08-29 NOTE — Progress Notes (Signed)
Follow Up Note: Gyn-Onc  Lori Little 76 y.o. female  CC:  Chief Complaint  Patient presents with  . Carcinosarcoma of the uterus    Follow up    HPI:  Lori Little is a 76 year old female who initially presented in late December 2009 to Dr. Verlon Au office with vaginal spotting.  An ultrasound was performed along with a repeat sonohystogram at a return visit in January 2010.  She presented to GYN Oncology in February 2010 as a new patient.  She underwent a total abdominal hysterectomy, bilateral salpingo-oophorectomy, and pelvic/ para-aortic lymphadenectomy in February 2010.  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  There was deep myometrial invasion with the tumor invading the entire endometrial cavity and lower uterine segment.  Furthermore, 2 out of 12 pelvic lymph nodes were positive.  Post-operatively, she received 6 cycles of carboplatin and taxol, which was completed in July 2010.  She has remained clinically free of disease since.       Interval History:  She returns today for continued follow up.  She reports that she has been doing well since her last visit with no new medical problems.  She has been traveling to the [place] with her family.  She continues to see Dr. Reynaldo Minium and recently had her physical in January.  She sees him every four months and states her hemoglobin A1C is good.  The numbness in her left hand, 3rd and 4th digit, has remained stable and comes intermittently.  She continues to wear a brace for carpal tunnel at bedtime on the left wrist.  Denies GI or GU symptoms.  Mammogram up to date from May.  Remains up to date with her colonoscopy with Dr. Reynaldo Minium managing her preventative care needs.  She sees Dr. Ubaldo Glassing for dermatology and has an opthalmologist as well.  No concerns voiced.  Review of Systems Constitutional: Feels well.  No fever, chills, early satiety, change in weight or appetite.  Able to complete activities of  daily living without difficulty.  Skin: No rash, sores, jaundice, itching, dryness.  Cardiovascular: No chest pain, shortness of breath, or edema.  Pulmonary: No cough.  No hemoptysis.  No wheeze.  Gastro Intestinal: No nausea, vomiting, constipation, or diarrhea reported. No bright red blood per rectum, no abdominal pain, or change in bowel movement.  Genitourinary: No frequency, urgency, or dysuria.  Musculoskeletal: No myalgia, arthralgia, joint swelling or pain. Neurologic: No weakness or change in gait.     Psychology: No depression, anxiety, or insomnia.   Current Meds:  Outpatient Encounter Prescriptions as of 08/29/2016  Medication Sig  . alendronate (FOSAMAX) 70 MG tablet Take 70 mg by mouth every 7 (seven) days. Takes on Fridays. Take with a full glass of water on an empty stomach.  Marland Kitchen aspirin EC 81 MG tablet Take 81 mg by mouth daily.  . bisacodyl (DULCOLAX) 5 MG EC tablet Take 5 mg by mouth daily as needed for constipation.  . Calcium Carbonate-Vit D-Min (CALCIUM 1200 PO) Take 1 tablet by mouth 2 (two) times daily.  . Coenzyme Q10 200 MG capsule Take 200 mg by mouth daily.  . fish oil-omega-3 fatty acids 1000 MG capsule Take 1 g by mouth daily.  . Glucosamine-Chondroitin (COSAMIN DS PO) Take 1 tablet by mouth daily. 1500 Glucosamine & 1200 Chondroitin   . GRAPE SEED EXTRACT PO Take 2 tablets by mouth daily.  Marland Kitchen levothyroxine (SYNTHROID, LEVOTHROID) 100 MCG tablet Take 100 mcg by  mouth daily before breakfast.   . metFORMIN (GLUCOPHAGE) 500 MG tablet Take 1,000 mg by mouth 2 (two) times daily with a meal.  . Multiple Vitamins-Minerals (MULTIVITAMIN PO) Take 2 tablets by mouth daily.  Marland Kitchen olmesartan-hydrochlorothiazide (BENICAR HCT) 40-12.5 MG per tablet Take 1 tablet by mouth daily.  Marland Kitchen omeprazole (PRILOSEC) 20 MG capsule Take 20 mg by mouth daily.  Marland Kitchen pyridOXINE (VITAMIN B-6) 100 MG tablet Take 100 mg by mouth daily.  . Red Yeast Rice 600 MG TABS Take 2 tablets by mouth daily.  .  vitamin B-12 (CYANOCOBALAMIN) 1000 MCG tablet Take 1,000 mcg by mouth daily.   No facility-administered encounter medications on file as of 08/29/2016.    Allergy:  Allergies  Allergen Reactions  . Sennosides Hives    Ex- Lax   Social Hx:   Social History   Social History  . Marital status: Divorced    Spouse name: N/A  . Number of children: N/A  . Years of education: N/A   Occupational History  . Not on file.   Social History Main Topics  . Smoking status: Former Smoker    Years: 6.00    Quit date: 02/21/1976  . Smokeless tobacco: Never Used  . Alcohol use No  . Drug use: No  . Sexual activity: Not on file   Other Topics Concern  . Not on file   Social History Narrative  . No narrative on file   Past Surgical Hx:  Past Surgical History:  Procedure Laterality Date  . ABDOMINAL HYSTERECTOMY     TAH with BSO, pelvic & periaortic lymphadenectomy  . CATARACT EXTRACTION    . GANGLION CYST EXCISION     Right wrist  . TONSILLECTOMY     with adenoidectomy  . UMBILICAL HERNIA REPAIR     Past Medical Hx:  Past Medical History:  Diagnosis Date  . Carcinosarcoma of uterus ([place]) 04/22/2012  . Diabetes mellitus   . HTN (hypertension) 07/29/2012  . Hypertension   . Hypothyroid 07/29/2012  . Hypothyroidism   . Obesity   . Pericardial effusion 07/30/2012  . Plantar fasciitis   . Tachycardia 07/30/2012  . Uterine cancer ([place])    uterine ca dx 2010   Family Hx:  Family History  Problem Relation Age of Onset  . Lung cancer Mother   . Diabetes Father   . Healthy Sister   . Healthy Sister   . Healthy Sister     Vitals:  Blood pressure (!) 125/57, pulse 73, temperature 98.2 F (36.8 C), temperature source Oral, resp. rate 20, weight 189 lb 4.8 oz (85.9 kg), SpO2 96 %.  Physical Exam:  General: Well developed, well nourished female in no acute distress. Alert, oriented x3. Head/Neck:  Neck supple.  Oropharynx clear.  Sclerae anicteric.    Cardiovascular: Heart rate and  rhythm regular. S1 and S2 normal. No murmurs, clicks, or rubs noted.  Lungs: Clear to auscultation bilaterally. No wheezes, crackles, or rhonchi.  Skin: No rash, lesions, or skin breakdown noted. Lymph:  No subclavicular, axillary, or inguinal adenopathy noted.  Breasts: Inspection normal without erythema or skin changes noted.  No masses or nodularity noted bilaterally on palpation.  No discharge or bleeding noted from the nipples. Abdomen: Normoactive bowel sounds, abdomen soft, non-tender and obese.  No evidence of organomegaly or abdominal masses.  Pelvic: Vulva: Normal external female genitalia. No lesions.  EGBUS: Normal, No lesions or masses.  Vagina: No visible lesions or palpable vaginal/ pelvic masses. No discharge or  bleeding noted.  Cervix and uterus surgically absent.   Rectal: Good rectal tone. No masses. No cul de sac nodularity.  Extremities: Moderate varicosities noted bilaterally with no edema.  No cyanosis.  Assessment/Plan:  76 year old female with Stage IIIC1 carcinosarcoma of the uterus.  Status post surgical resection in February 2010 and adjuvant chemotherapy including carboplatin and taxol, which was completed in July of 2010.  She is clinically free of disease with plans to follow up with GYN Oncology in 1 year per Dr. Fermin Schwab for continued follow up or sooner if needed.  Reportable signs and symptoms reviewed. She is advised to call for any questions or concerns or new symptoms.      CROSS, MELISSA DEAL, NP 08/29/2016, 12:17 PM

## 2016-08-29 NOTE — Patient Instructions (Signed)
Doing great!  Plan to follow up in one year or sooner if needed.  Please call closer to the date to schedule.

## 2016-10-02 DIAGNOSIS — I10 Essential (primary) hypertension: Secondary | ICD-10-CM | POA: Diagnosis not present

## 2016-10-02 DIAGNOSIS — E784 Other hyperlipidemia: Secondary | ICD-10-CM | POA: Diagnosis not present

## 2016-10-02 DIAGNOSIS — R079 Chest pain, unspecified: Secondary | ICD-10-CM | POA: Diagnosis not present

## 2016-10-02 DIAGNOSIS — Z1389 Encounter for screening for other disorder: Secondary | ICD-10-CM | POA: Diagnosis not present

## 2016-10-02 DIAGNOSIS — M1990 Unspecified osteoarthritis, unspecified site: Secondary | ICD-10-CM | POA: Diagnosis not present

## 2016-10-02 DIAGNOSIS — Z8542 Personal history of malignant neoplasm of other parts of uterus: Secondary | ICD-10-CM | POA: Diagnosis not present

## 2016-10-02 DIAGNOSIS — M810 Age-related osteoporosis without current pathological fracture: Secondary | ICD-10-CM | POA: Diagnosis not present

## 2016-10-02 DIAGNOSIS — E1149 Type 2 diabetes mellitus with other diabetic neurological complication: Secondary | ICD-10-CM | POA: Diagnosis not present

## 2016-10-02 DIAGNOSIS — Z6831 Body mass index (BMI) 31.0-31.9, adult: Secondary | ICD-10-CM | POA: Diagnosis not present

## 2016-10-02 DIAGNOSIS — G629 Polyneuropathy, unspecified: Secondary | ICD-10-CM | POA: Diagnosis not present

## 2016-12-14 ENCOUNTER — Telehealth: Payer: Self-pay | Admitting: Gynecologic Oncology

## 2016-12-14 ENCOUNTER — Telehealth: Payer: Self-pay

## 2016-12-14 ENCOUNTER — Other Ambulatory Visit: Payer: Self-pay | Admitting: Gynecologic Oncology

## 2016-12-14 ENCOUNTER — Other Ambulatory Visit (HOSPITAL_BASED_OUTPATIENT_CLINIC_OR_DEPARTMENT_OTHER): Payer: Medicare Other

## 2016-12-14 DIAGNOSIS — N390 Urinary tract infection, site not specified: Secondary | ICD-10-CM

## 2016-12-14 DIAGNOSIS — R350 Frequency of micturition: Secondary | ICD-10-CM

## 2016-12-14 DIAGNOSIS — R3915 Urgency of urination: Secondary | ICD-10-CM | POA: Diagnosis not present

## 2016-12-14 DIAGNOSIS — R319 Hematuria, unspecified: Principal | ICD-10-CM

## 2016-12-14 LAB — URINALYSIS, MICROSCOPIC - CHCC
Bilirubin (Urine): NEGATIVE
Glucose: NEGATIVE mg/dL
Ketones: NEGATIVE mg/dL
Nitrite: NEGATIVE
Protein: <30 mg/dL
Specific Gravity, Urine: 1.03 (ref 1.003–1.035)
Urobilinogen, UR: 0.2 mg/dL (ref 0.2–1)
pH: 6 (ref 4.6–8.0)

## 2016-12-14 MED ORDER — SULFAMETHOXAZOLE-TRIMETHOPRIM 800-160 MG PO TABS
1.0000 | ORAL_TABLET | Freq: Two times a day (BID) | ORAL | 0 refills | Status: DC
Start: 2016-12-14 — End: 2020-12-07

## 2016-12-14 NOTE — Telephone Encounter (Signed)
Attempted to call patient with urine results.  Advised patient to please call the office to discuss urine results.

## 2016-12-14 NOTE — Telephone Encounter (Signed)
Pt with cc of urinary frequency and urgency the last several days.  She feels that she may have a UTI.  Told her that Zoila Shutter said for her to come in for a U/A C&S.   Appointment for 2 pm for lab made.

## 2016-12-14 NOTE — Progress Notes (Signed)
Patient returned call and would like to start an antibiotic.  Advised we would send something in and call her when the culture comes back.

## 2016-12-19 LAB — URINE CULTURE

## 2017-01-03 DIAGNOSIS — H3589 Other specified retinal disorders: Secondary | ICD-10-CM | POA: Diagnosis not present

## 2017-01-03 DIAGNOSIS — H40053 Ocular hypertension, bilateral: Secondary | ICD-10-CM | POA: Diagnosis not present

## 2017-01-03 DIAGNOSIS — Z83511 Family history of glaucoma: Secondary | ICD-10-CM | POA: Diagnosis not present

## 2017-01-03 DIAGNOSIS — H52223 Regular astigmatism, bilateral: Secondary | ICD-10-CM | POA: Diagnosis not present

## 2017-01-03 DIAGNOSIS — H524 Presbyopia: Secondary | ICD-10-CM | POA: Diagnosis not present

## 2017-01-03 DIAGNOSIS — E119 Type 2 diabetes mellitus without complications: Secondary | ICD-10-CM | POA: Diagnosis not present

## 2017-01-03 DIAGNOSIS — H5203 Hypermetropia, bilateral: Secondary | ICD-10-CM | POA: Diagnosis not present

## 2017-01-03 DIAGNOSIS — H40013 Open angle with borderline findings, low risk, bilateral: Secondary | ICD-10-CM | POA: Diagnosis not present

## 2017-01-18 DIAGNOSIS — I10 Essential (primary) hypertension: Secondary | ICD-10-CM | POA: Diagnosis not present

## 2017-01-18 DIAGNOSIS — R82998 Other abnormal findings in urine: Secondary | ICD-10-CM | POA: Diagnosis not present

## 2017-01-18 DIAGNOSIS — E7849 Other hyperlipidemia: Secondary | ICD-10-CM | POA: Diagnosis not present

## 2017-01-18 DIAGNOSIS — M810 Age-related osteoporosis without current pathological fracture: Secondary | ICD-10-CM | POA: Diagnosis not present

## 2017-01-18 DIAGNOSIS — E1149 Type 2 diabetes mellitus with other diabetic neurological complication: Secondary | ICD-10-CM | POA: Diagnosis not present

## 2017-01-24 DIAGNOSIS — Z0000 Encounter for general adult medical examination without abnormal findings: Secondary | ICD-10-CM | POA: Diagnosis not present

## 2017-01-24 DIAGNOSIS — Z8542 Personal history of malignant neoplasm of other parts of uterus: Secondary | ICD-10-CM | POA: Diagnosis not present

## 2017-01-24 DIAGNOSIS — Z6830 Body mass index (BMI) 30.0-30.9, adult: Secondary | ICD-10-CM | POA: Diagnosis not present

## 2017-01-24 DIAGNOSIS — E1149 Type 2 diabetes mellitus with other diabetic neurological complication: Secondary | ICD-10-CM | POA: Diagnosis not present

## 2017-01-24 DIAGNOSIS — E669 Obesity, unspecified: Secondary | ICD-10-CM | POA: Diagnosis not present

## 2017-01-24 DIAGNOSIS — G629 Polyneuropathy, unspecified: Secondary | ICD-10-CM | POA: Diagnosis not present

## 2017-01-24 DIAGNOSIS — Z23 Encounter for immunization: Secondary | ICD-10-CM | POA: Diagnosis not present

## 2017-01-24 DIAGNOSIS — I10 Essential (primary) hypertension: Secondary | ICD-10-CM | POA: Diagnosis not present

## 2017-01-24 DIAGNOSIS — E7849 Other hyperlipidemia: Secondary | ICD-10-CM | POA: Diagnosis not present

## 2017-01-24 DIAGNOSIS — K219 Gastro-esophageal reflux disease without esophagitis: Secondary | ICD-10-CM | POA: Diagnosis not present

## 2017-01-24 DIAGNOSIS — M810 Age-related osteoporosis without current pathological fracture: Secondary | ICD-10-CM | POA: Diagnosis not present

## 2017-01-24 DIAGNOSIS — M1990 Unspecified osteoarthritis, unspecified site: Secondary | ICD-10-CM | POA: Diagnosis not present

## 2017-01-31 DIAGNOSIS — Z1212 Encounter for screening for malignant neoplasm of rectum: Secondary | ICD-10-CM | POA: Diagnosis not present

## 2017-03-02 DIAGNOSIS — H40053 Ocular hypertension, bilateral: Secondary | ICD-10-CM | POA: Diagnosis not present

## 2017-03-02 DIAGNOSIS — H40011 Open angle with borderline findings, low risk, right eye: Secondary | ICD-10-CM | POA: Diagnosis not present

## 2017-03-02 DIAGNOSIS — H26493 Other secondary cataract, bilateral: Secondary | ICD-10-CM | POA: Diagnosis not present

## 2017-03-02 DIAGNOSIS — Z961 Presence of intraocular lens: Secondary | ICD-10-CM | POA: Diagnosis not present

## 2017-03-02 DIAGNOSIS — H40013 Open angle with borderline findings, low risk, bilateral: Secondary | ICD-10-CM | POA: Diagnosis not present

## 2017-06-19 DIAGNOSIS — E7849 Other hyperlipidemia: Secondary | ICD-10-CM | POA: Diagnosis not present

## 2017-06-19 DIAGNOSIS — K219 Gastro-esophageal reflux disease without esophagitis: Secondary | ICD-10-CM | POA: Diagnosis not present

## 2017-06-19 DIAGNOSIS — Z8542 Personal history of malignant neoplasm of other parts of uterus: Secondary | ICD-10-CM | POA: Diagnosis not present

## 2017-06-19 DIAGNOSIS — M810 Age-related osteoporosis without current pathological fracture: Secondary | ICD-10-CM | POA: Diagnosis not present

## 2017-06-19 DIAGNOSIS — E669 Obesity, unspecified: Secondary | ICD-10-CM | POA: Diagnosis not present

## 2017-06-19 DIAGNOSIS — Z6829 Body mass index (BMI) 29.0-29.9, adult: Secondary | ICD-10-CM | POA: Diagnosis not present

## 2017-06-19 DIAGNOSIS — M1990 Unspecified osteoarthritis, unspecified site: Secondary | ICD-10-CM | POA: Diagnosis not present

## 2017-06-19 DIAGNOSIS — I10 Essential (primary) hypertension: Secondary | ICD-10-CM | POA: Diagnosis not present

## 2017-06-19 DIAGNOSIS — E1149 Type 2 diabetes mellitus with other diabetic neurological complication: Secondary | ICD-10-CM | POA: Diagnosis not present

## 2017-06-19 DIAGNOSIS — G629 Polyneuropathy, unspecified: Secondary | ICD-10-CM | POA: Diagnosis not present

## 2017-06-19 DIAGNOSIS — R079 Chest pain, unspecified: Secondary | ICD-10-CM | POA: Diagnosis not present

## 2017-07-11 DIAGNOSIS — Z1231 Encounter for screening mammogram for malignant neoplasm of breast: Secondary | ICD-10-CM | POA: Diagnosis not present

## 2017-10-05 DIAGNOSIS — H40003 Preglaucoma, unspecified, bilateral: Secondary | ICD-10-CM | POA: Diagnosis not present

## 2017-10-05 DIAGNOSIS — H40013 Open angle with borderline findings, low risk, bilateral: Secondary | ICD-10-CM | POA: Diagnosis not present

## 2017-10-05 DIAGNOSIS — H40053 Ocular hypertension, bilateral: Secondary | ICD-10-CM | POA: Diagnosis not present

## 2017-11-14 DIAGNOSIS — G629 Polyneuropathy, unspecified: Secondary | ICD-10-CM | POA: Diagnosis not present

## 2017-11-14 DIAGNOSIS — Z23 Encounter for immunization: Secondary | ICD-10-CM | POA: Diagnosis not present

## 2017-11-14 DIAGNOSIS — Z8542 Personal history of malignant neoplasm of other parts of uterus: Secondary | ICD-10-CM | POA: Diagnosis not present

## 2017-11-14 DIAGNOSIS — M810 Age-related osteoporosis without current pathological fracture: Secondary | ICD-10-CM | POA: Diagnosis not present

## 2017-11-14 DIAGNOSIS — M1990 Unspecified osteoarthritis, unspecified site: Secondary | ICD-10-CM | POA: Diagnosis not present

## 2017-11-14 DIAGNOSIS — K219 Gastro-esophageal reflux disease without esophagitis: Secondary | ICD-10-CM | POA: Diagnosis not present

## 2017-11-14 DIAGNOSIS — E663 Overweight: Secondary | ICD-10-CM | POA: Diagnosis not present

## 2017-11-14 DIAGNOSIS — Z1389 Encounter for screening for other disorder: Secondary | ICD-10-CM | POA: Diagnosis not present

## 2017-11-14 DIAGNOSIS — I10 Essential (primary) hypertension: Secondary | ICD-10-CM | POA: Diagnosis not present

## 2017-11-14 DIAGNOSIS — E7849 Other hyperlipidemia: Secondary | ICD-10-CM | POA: Diagnosis not present

## 2017-11-14 DIAGNOSIS — E1149 Type 2 diabetes mellitus with other diabetic neurological complication: Secondary | ICD-10-CM | POA: Diagnosis not present

## 2018-03-13 DIAGNOSIS — I10 Essential (primary) hypertension: Secondary | ICD-10-CM | POA: Diagnosis not present

## 2018-03-13 DIAGNOSIS — E1149 Type 2 diabetes mellitus with other diabetic neurological complication: Secondary | ICD-10-CM | POA: Diagnosis not present

## 2018-03-13 DIAGNOSIS — R82998 Other abnormal findings in urine: Secondary | ICD-10-CM | POA: Diagnosis not present

## 2018-03-13 DIAGNOSIS — E7849 Other hyperlipidemia: Secondary | ICD-10-CM | POA: Diagnosis not present

## 2018-03-13 DIAGNOSIS — M810 Age-related osteoporosis without current pathological fracture: Secondary | ICD-10-CM | POA: Diagnosis not present

## 2018-03-20 DIAGNOSIS — E7849 Other hyperlipidemia: Secondary | ICD-10-CM | POA: Diagnosis not present

## 2018-03-20 DIAGNOSIS — Z1331 Encounter for screening for depression: Secondary | ICD-10-CM | POA: Diagnosis not present

## 2018-03-20 DIAGNOSIS — M779 Enthesopathy, unspecified: Secondary | ICD-10-CM | POA: Diagnosis not present

## 2018-03-20 DIAGNOSIS — Z8349 Family history of other endocrine, nutritional and metabolic diseases: Secondary | ICD-10-CM | POA: Diagnosis not present

## 2018-03-20 DIAGNOSIS — I10 Essential (primary) hypertension: Secondary | ICD-10-CM | POA: Diagnosis not present

## 2018-03-20 DIAGNOSIS — M810 Age-related osteoporosis without current pathological fracture: Secondary | ICD-10-CM | POA: Diagnosis not present

## 2018-03-20 DIAGNOSIS — K219 Gastro-esophageal reflux disease without esophagitis: Secondary | ICD-10-CM | POA: Diagnosis not present

## 2018-03-20 DIAGNOSIS — Z1339 Encounter for screening examination for other mental health and behavioral disorders: Secondary | ICD-10-CM | POA: Diagnosis not present

## 2018-03-20 DIAGNOSIS — M1990 Unspecified osteoarthritis, unspecified site: Secondary | ICD-10-CM | POA: Diagnosis not present

## 2018-03-20 DIAGNOSIS — E1149 Type 2 diabetes mellitus with other diabetic neurological complication: Secondary | ICD-10-CM | POA: Diagnosis not present

## 2018-03-20 DIAGNOSIS — G629 Polyneuropathy, unspecified: Secondary | ICD-10-CM | POA: Diagnosis not present

## 2018-03-20 DIAGNOSIS — Z0000 Encounter for general adult medical examination without abnormal findings: Secondary | ICD-10-CM | POA: Diagnosis not present

## 2018-03-29 DIAGNOSIS — Z1212 Encounter for screening for malignant neoplasm of rectum: Secondary | ICD-10-CM | POA: Diagnosis not present

## 2018-08-21 DIAGNOSIS — Z8542 Personal history of malignant neoplasm of other parts of uterus: Secondary | ICD-10-CM | POA: Diagnosis not present

## 2018-08-21 DIAGNOSIS — G629 Polyneuropathy, unspecified: Secondary | ICD-10-CM | POA: Diagnosis not present

## 2018-08-21 DIAGNOSIS — E1149 Type 2 diabetes mellitus with other diabetic neurological complication: Secondary | ICD-10-CM | POA: Diagnosis not present

## 2018-08-21 DIAGNOSIS — M810 Age-related osteoporosis without current pathological fracture: Secondary | ICD-10-CM | POA: Diagnosis not present

## 2018-08-21 DIAGNOSIS — K219 Gastro-esophageal reflux disease without esophagitis: Secondary | ICD-10-CM | POA: Diagnosis not present

## 2018-08-21 DIAGNOSIS — M1990 Unspecified osteoarthritis, unspecified site: Secondary | ICD-10-CM | POA: Diagnosis not present

## 2018-08-21 DIAGNOSIS — Z8349 Family history of other endocrine, nutritional and metabolic diseases: Secondary | ICD-10-CM | POA: Diagnosis not present

## 2018-08-21 DIAGNOSIS — E785 Hyperlipidemia, unspecified: Secondary | ICD-10-CM | POA: Diagnosis not present

## 2018-08-21 DIAGNOSIS — E663 Overweight: Secondary | ICD-10-CM | POA: Diagnosis not present

## 2018-08-21 DIAGNOSIS — I10 Essential (primary) hypertension: Secondary | ICD-10-CM | POA: Diagnosis not present

## 2018-09-25 ENCOUNTER — Telehealth: Payer: Self-pay

## 2018-09-25 NOTE — Telephone Encounter (Signed)
Pt not having any issues. She just needs to schedule an appointment with Melissa. Requested that she call back in a couple of weeks as the office schedule is currently being updated and Melissa will then be able to determine when she can see patient's in the office. Pt verbalized understanding.

## 2018-10-09 DIAGNOSIS — H52223 Regular astigmatism, bilateral: Secondary | ICD-10-CM | POA: Diagnosis not present

## 2018-10-09 DIAGNOSIS — H40053 Ocular hypertension, bilateral: Secondary | ICD-10-CM | POA: Diagnosis not present

## 2018-10-09 DIAGNOSIS — H40013 Open angle with borderline findings, low risk, bilateral: Secondary | ICD-10-CM | POA: Diagnosis not present

## 2018-10-09 DIAGNOSIS — H524 Presbyopia: Secondary | ICD-10-CM | POA: Diagnosis not present

## 2018-10-09 DIAGNOSIS — Z961 Presence of intraocular lens: Secondary | ICD-10-CM | POA: Diagnosis not present

## 2018-10-09 DIAGNOSIS — E119 Type 2 diabetes mellitus without complications: Secondary | ICD-10-CM | POA: Diagnosis not present

## 2018-10-09 DIAGNOSIS — H5203 Hypermetropia, bilateral: Secondary | ICD-10-CM | POA: Diagnosis not present

## 2018-10-14 DIAGNOSIS — Z1231 Encounter for screening mammogram for malignant neoplasm of breast: Secondary | ICD-10-CM | POA: Diagnosis not present

## 2018-11-18 ENCOUNTER — Inpatient Hospital Stay: Payer: Medicare Other | Attending: Gynecologic Oncology | Admitting: Gynecologic Oncology

## 2018-11-18 ENCOUNTER — Encounter: Payer: Self-pay | Admitting: Gynecologic Oncology

## 2018-11-18 ENCOUNTER — Other Ambulatory Visit: Payer: Self-pay

## 2018-11-18 VITALS — BP 153/76 | HR 77 | Temp 98.7°F | Resp 20 | Ht 64.0 in | Wt 173.0 lb

## 2018-11-18 DIAGNOSIS — Z7982 Long term (current) use of aspirin: Secondary | ICD-10-CM | POA: Diagnosis not present

## 2018-11-18 DIAGNOSIS — Z90722 Acquired absence of ovaries, bilateral: Secondary | ICD-10-CM | POA: Diagnosis not present

## 2018-11-18 DIAGNOSIS — E119 Type 2 diabetes mellitus without complications: Secondary | ICD-10-CM | POA: Diagnosis not present

## 2018-11-18 DIAGNOSIS — I10 Essential (primary) hypertension: Secondary | ICD-10-CM | POA: Insufficient documentation

## 2018-11-18 DIAGNOSIS — E669 Obesity, unspecified: Secondary | ICD-10-CM | POA: Diagnosis not present

## 2018-11-18 DIAGNOSIS — Z90710 Acquired absence of both cervix and uterus: Secondary | ICD-10-CM | POA: Diagnosis not present

## 2018-11-18 DIAGNOSIS — Z79899 Other long term (current) drug therapy: Secondary | ICD-10-CM | POA: Insufficient documentation

## 2018-11-18 DIAGNOSIS — E039 Hypothyroidism, unspecified: Secondary | ICD-10-CM | POA: Diagnosis not present

## 2018-11-18 DIAGNOSIS — C55 Malignant neoplasm of uterus, part unspecified: Secondary | ICD-10-CM | POA: Insufficient documentation

## 2018-11-18 DIAGNOSIS — Z7984 Long term (current) use of oral hypoglycemic drugs: Secondary | ICD-10-CM | POA: Diagnosis not present

## 2018-11-18 DIAGNOSIS — R000 Tachycardia, unspecified: Secondary | ICD-10-CM | POA: Diagnosis not present

## 2018-11-18 DIAGNOSIS — I313 Pericardial effusion (noninflammatory): Secondary | ICD-10-CM | POA: Diagnosis not present

## 2018-11-18 NOTE — Patient Instructions (Signed)
Doing well.  Nothing concerning on today exam.  Congratulations on your weight loss and continue your efforts. Plan to follow up in one year or sooner if needed.  Please call for any questions, concerns, or new symptoms such as bleeding, abdominal pain, change in appetite or bladder/ bowel habits.  Please call the office at [phone] in June or July 2021 to schedule an appointment for September 2021.

## 2018-11-18 NOTE — Progress Notes (Signed)
Follow Up Note: Gyn-Onc  Lori Little 78 y.o. female  CC:  Chief Complaint  Patient presents with  . Uterine Cancer    Follow Up    HPI:  Lori Little is a 79 year old female who initially presented in late December 2009 to Dr. Verlon Au office with vaginal spotting.  An ultrasound was performed along with a repeat sonohystogram at a return visit in January 2010.  She presented to GYN Oncology in February 2010 as a new patient.  She underwent a total abdominal hysterectomy, bilateral salpingo-oophorectomy, and pelvic/ para-aortic lymphadenectomy in February 2010.  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  There was deep myometrial invasion with the tumor invading the entire endometrial cavity and lower uterine segment.  Furthermore, 2 out of 12 pelvic lymph nodes were positive.  Post-operatively, she received 6 cycles of carboplatin and taxol, which was completed in July 2010.  She has remained clinically free of disease since.       Interval History:  She returns today for continued follow up.  She reports that she has been doing well since her last visit with no new medical problems.  She recently downsized earlier this year and states she loves living in her town home.  She has stayed healthy during the [place] outbreak and continues to see Dr. Reynaldo Minium.  Her hemoglobin A1C has remained good.  Her weight is down from her last visit and she states she was in the 160s after watching what she eats and walking on the treadmill. The numbness in her left hand, 3rd and 4th digit, has remained stable and comes intermittently.  Denies GI or GU symptoms.  Mammogram up to date from August 2020.  Remains up to date with her colonoscopy with Dr. Reynaldo Minium managing her preventative care needs.  She sees Dr. Ubaldo Glassing for dermatology and has an opthalmologist as well.  No concerns voiced.  Review of Systems Constitutional: Feels well.  No fever, chills, early satiety, change in weight or  appetite.  Able to complete activities of daily living without difficulty.  Skin: No rash, sores, jaundice, itching, dryness.  Cardiovascular: No chest pain, shortness of breath, or edema.  Pulmonary: No cough.  No hemoptysis.  No wheeze.  Gastro Intestinal: No nausea, vomiting, constipation, or diarrhea reported. No bright red blood per rectum, no abdominal pain, or change in bowel movement.  Genitourinary: No frequency, urgency, or dysuria.  Musculoskeletal: No myalgia, arthralgia, joint swelling or pain. Neurologic: No weakness or change in gait.     Psychology: No depression, anxiety, or insomnia.   Current Meds:  Outpatient Encounter Medications as of 11/18/2018  Medication Sig  . alendronate (FOSAMAX) 70 MG tablet Take 70 mg by mouth every 7 (seven) days. Takes on Fridays. Take with a full glass of water on an empty stomach.  Marland Kitchen aspirin EC 81 MG tablet Take 81 mg by mouth daily.  . bisacodyl (DULCOLAX) 5 MG EC tablet Take 5 mg by mouth daily as needed for constipation.  . Calcium Carbonate-Vit D-Min (CALCIUM 1200 PO) Take 1 tablet by mouth 2 (two) times daily.  . Coenzyme Q10 200 MG capsule Take 200 mg by mouth daily.  . fish oil-omega-3 fatty acids 1000 MG capsule Take 1 g by mouth daily.  . Glucosamine-Chondroitin (COSAMIN DS PO) Take 1 tablet by mouth daily. 1500 Glucosamine & 1200 Chondroitin   . GRAPE SEED EXTRACT PO Take 2 tablets by mouth daily.  Marland Kitchen levothyroxine (SYNTHROID, LEVOTHROID) 100 MCG  tablet Take 100 mcg by mouth daily before breakfast.   . metFORMIN (GLUCOPHAGE) 500 MG tablet Take 1,000 mg by mouth 2 (two) times daily with a meal.  . Multiple Vitamins-Minerals (MULTIVITAMIN PO) Take 2 tablets by mouth daily.  Marland Kitchen olmesartan-hydrochlorothiazide (BENICAR HCT) 40-12.5 MG per tablet Take 0.5 tablets by mouth daily.   Marland Kitchen pyridOXINE (VITAMIN B-6) 100 MG tablet Take 100 mg by mouth daily.  . Red Yeast Rice 600 MG TABS Take 2 tablets by mouth daily.  Marland Kitchen  sulfamethoxazole-trimethoprim (BACTRIM DS,SEPTRA DS) 800-160 MG tablet Take 1 tablet by mouth 2 (two) times daily.  . vitamin B-12 (CYANOCOBALAMIN) 1000 MCG tablet Take 1,000 mcg by mouth daily.  . [DISCONTINUED] omeprazole (PRILOSEC) 20 MG capsule Take 20 mg by mouth daily.   No facility-administered encounter medications on file as of 11/18/2018.    Allergy:  Allergies  Allergen Reactions  . Sennosides Hives    Ex- Lax   Social Hx:   Social History   Socioeconomic History  . Marital status: Divorced    Spouse name: Not on file  . Number of children: Not on file  . Years of education: Not on file  . Highest education level: Not on file  Occupational History  . Not on file  Social Needs  . Financial resource strain: Not on file  . Food insecurity    Worry: Not on file    Inability: Not on file  . Transportation needs    Medical: Not on file    Non-medical: Not on file  Tobacco Use  . Smoking status: Former Smoker    Years: 6.00    Quit date: 02/21/1976    Years since quitting: 42.7  . Smokeless tobacco: Never Used  Substance and Sexual Activity  . Alcohol use: No  . Drug use: No  . Sexual activity: Not on file  Lifestyle  . Physical activity    Days per week: Not on file    Minutes per session: Not on file  . Stress: Not on file  Relationships  . Social Herbalist on phone: Not on file    Gets together: Not on file    Attends religious service: Not on file    Active member of club or organization: Not on file    Attends meetings of clubs or organizations: Not on file    Relationship status: Not on file  . Intimate partner violence    Fear of current or ex partner: Not on file    Emotionally abused: Not on file    Physically abused: Not on file    Forced sexual activity: Not on file  Other Topics Concern  . Not on file  Social History Narrative  . Not on file   Past Surgical Hx:  Past Surgical History:  Procedure Laterality Date  . ABDOMINAL  HYSTERECTOMY     TAH with BSO, pelvic & periaortic lymphadenectomy  . CATARACT EXTRACTION    . GANGLION CYST EXCISION     Right wrist  . TONSILLECTOMY     with adenoidectomy  . UMBILICAL HERNIA REPAIR     Past Medical Hx:  Past Medical History:  Diagnosis Date  . Carcinosarcoma of uterus ([place]) 04/22/2012  . Diabetes mellitus   . HTN (hypertension) 07/29/2012  . Hypertension   . Hypothyroid 07/29/2012  . Hypothyroidism   . Obesity   . Pericardial effusion 07/30/2012  . Plantar fasciitis   . Tachycardia 07/30/2012  . Uterine cancer ([place])  uterine ca dx 2010   Family Hx:  Family History  Problem Relation Age of Onset  . Lung cancer Mother   . Diabetes Father   . Healthy Sister   . Healthy Sister   . Healthy Sister     Vitals:  Blood pressure (!) 153/76, pulse 77, temperature 98.7 F (37.1 C), temperature source Tympanic, resp. rate 20, height 5\' 4"  (1.626 m), weight 173 lb (78.5 kg), SpO2 98 %.  Physical Exam:  General: Well developed, well nourished female in no acute distress. Alert, oriented x3. Cardiovascular: Heart rate and rhythm regular. S1 and S2 normal. No murmurs, clicks, or rubs noted.  Lungs: Clear to auscultation bilaterally. No wheezes, crackles, or rhonchi.  Skin: No rash, lesions, or skin breakdown noted. Abdomen: Normoactive bowel sounds, abdomen soft, non-tender and obese.  No evidence of organomegaly or abdominal masses.  Pelvic: Vulva: Normal external female genitalia. No lesions.  EGBUS: Normal, No lesions or masses.  Vagina: Atrophic. No visible lesions or palpable vaginal/ pelvic masses. No discharge or bleeding noted.  Cervix and uterus surgically absent.   Rectal: Good rectal tone. No masses. No cul de sac nodularity.  Extremities: Moderate varicosities noted bilaterally with no edema.  No cyanosis.  Assessment/Plan:  78 year old female with Stage IIIC1 carcinosarcoma of the uterus.  Status post surgical resection in February 2010 and adjuvant  chemotherapy including carboplatin and taxol, which was completed in July of 2010.  She is clinically free of disease on today's exam with plans to follow up with GYN Oncology annually or every other year (if doing well with no symptoms with continued visits with PCP) per Dr. Fermin Schwab for continued follow up or sooner if needed.  Reportable signs and symptoms reviewed. She is advised to call for any questions or concerns or new symptoms.      Dorothyann Gibbs, NP 11/18/2018, 12:50 PM

## 2018-12-19 DIAGNOSIS — Z23 Encounter for immunization: Secondary | ICD-10-CM | POA: Diagnosis not present

## 2018-12-20 DIAGNOSIS — E1149 Type 2 diabetes mellitus with other diabetic neurological complication: Secondary | ICD-10-CM | POA: Diagnosis not present

## 2018-12-23 DIAGNOSIS — M1990 Unspecified osteoarthritis, unspecified site: Secondary | ICD-10-CM | POA: Diagnosis not present

## 2018-12-23 DIAGNOSIS — I10 Essential (primary) hypertension: Secondary | ICD-10-CM | POA: Diagnosis not present

## 2018-12-23 DIAGNOSIS — E1149 Type 2 diabetes mellitus with other diabetic neurological complication: Secondary | ICD-10-CM | POA: Diagnosis not present

## 2018-12-23 DIAGNOSIS — E785 Hyperlipidemia, unspecified: Secondary | ICD-10-CM | POA: Diagnosis not present

## 2019-03-04 DIAGNOSIS — Z23 Encounter for immunization: Secondary | ICD-10-CM | POA: Diagnosis not present

## 2019-03-27 DIAGNOSIS — E1149 Type 2 diabetes mellitus with other diabetic neurological complication: Secondary | ICD-10-CM | POA: Diagnosis not present

## 2019-03-27 DIAGNOSIS — M810 Age-related osteoporosis without current pathological fracture: Secondary | ICD-10-CM | POA: Diagnosis not present

## 2019-03-27 DIAGNOSIS — E7849 Other hyperlipidemia: Secondary | ICD-10-CM | POA: Diagnosis not present

## 2019-03-31 ENCOUNTER — Ambulatory Visit: Payer: Medicare Other

## 2019-03-31 DIAGNOSIS — R82998 Other abnormal findings in urine: Secondary | ICD-10-CM | POA: Diagnosis not present

## 2019-04-03 DIAGNOSIS — Z1331 Encounter for screening for depression: Secondary | ICD-10-CM | POA: Diagnosis not present

## 2019-04-03 DIAGNOSIS — E785 Hyperlipidemia, unspecified: Secondary | ICD-10-CM | POA: Diagnosis not present

## 2019-04-03 DIAGNOSIS — Z0000 Encounter for general adult medical examination without abnormal findings: Secondary | ICD-10-CM | POA: Diagnosis not present

## 2019-04-03 DIAGNOSIS — K219 Gastro-esophageal reflux disease without esophagitis: Secondary | ICD-10-CM | POA: Diagnosis not present

## 2019-04-03 DIAGNOSIS — Z1339 Encounter for screening examination for other mental health and behavioral disorders: Secondary | ICD-10-CM | POA: Diagnosis not present

## 2019-04-03 DIAGNOSIS — E1149 Type 2 diabetes mellitus with other diabetic neurological complication: Secondary | ICD-10-CM | POA: Diagnosis not present

## 2019-04-03 DIAGNOSIS — G629 Polyneuropathy, unspecified: Secondary | ICD-10-CM | POA: Diagnosis not present

## 2019-04-03 DIAGNOSIS — M1990 Unspecified osteoarthritis, unspecified site: Secondary | ICD-10-CM | POA: Diagnosis not present

## 2019-04-03 DIAGNOSIS — M810 Age-related osteoporosis without current pathological fracture: Secondary | ICD-10-CM | POA: Diagnosis not present

## 2019-04-03 DIAGNOSIS — I10 Essential (primary) hypertension: Secondary | ICD-10-CM | POA: Diagnosis not present

## 2019-04-04 DIAGNOSIS — Z23 Encounter for immunization: Secondary | ICD-10-CM | POA: Diagnosis not present

## 2019-04-09 DIAGNOSIS — Z1212 Encounter for screening for malignant neoplasm of rectum: Secondary | ICD-10-CM | POA: Diagnosis not present

## 2019-04-16 DIAGNOSIS — H43813 Vitreous degeneration, bilateral: Secondary | ICD-10-CM | POA: Diagnosis not present

## 2019-04-16 DIAGNOSIS — H43393 Other vitreous opacities, bilateral: Secondary | ICD-10-CM | POA: Diagnosis not present

## 2019-04-16 DIAGNOSIS — H40019 Open angle with borderline findings, low risk, unspecified eye: Secondary | ICD-10-CM | POA: Diagnosis not present

## 2019-04-16 DIAGNOSIS — Z961 Presence of intraocular lens: Secondary | ICD-10-CM | POA: Diagnosis not present

## 2019-04-24 DIAGNOSIS — L720 Epidermal cyst: Secondary | ICD-10-CM | POA: Diagnosis not present

## 2019-07-23 DIAGNOSIS — G629 Polyneuropathy, unspecified: Secondary | ICD-10-CM | POA: Diagnosis not present

## 2019-07-23 DIAGNOSIS — M1990 Unspecified osteoarthritis, unspecified site: Secondary | ICD-10-CM | POA: Diagnosis not present

## 2019-07-23 DIAGNOSIS — K219 Gastro-esophageal reflux disease without esophagitis: Secondary | ICD-10-CM | POA: Diagnosis not present

## 2019-07-23 DIAGNOSIS — I10 Essential (primary) hypertension: Secondary | ICD-10-CM | POA: Diagnosis not present

## 2019-07-23 DIAGNOSIS — E663 Overweight: Secondary | ICD-10-CM | POA: Diagnosis not present

## 2019-07-23 DIAGNOSIS — E1149 Type 2 diabetes mellitus with other diabetic neurological complication: Secondary | ICD-10-CM | POA: Diagnosis not present

## 2019-10-20 DIAGNOSIS — Z1231 Encounter for screening mammogram for malignant neoplasm of breast: Secondary | ICD-10-CM | POA: Diagnosis not present

## 2019-10-23 DIAGNOSIS — E663 Overweight: Secondary | ICD-10-CM | POA: Diagnosis not present

## 2019-10-23 DIAGNOSIS — G629 Polyneuropathy, unspecified: Secondary | ICD-10-CM | POA: Diagnosis not present

## 2019-10-23 DIAGNOSIS — E1149 Type 2 diabetes mellitus with other diabetic neurological complication: Secondary | ICD-10-CM | POA: Diagnosis not present

## 2019-10-23 DIAGNOSIS — I10 Essential (primary) hypertension: Secondary | ICD-10-CM | POA: Diagnosis not present

## 2019-10-29 DIAGNOSIS — I10 Essential (primary) hypertension: Secondary | ICD-10-CM | POA: Diagnosis not present

## 2019-10-29 DIAGNOSIS — E669 Obesity, unspecified: Secondary | ICD-10-CM | POA: Diagnosis not present

## 2019-10-29 DIAGNOSIS — E1149 Type 2 diabetes mellitus with other diabetic neurological complication: Secondary | ICD-10-CM | POA: Diagnosis not present

## 2019-11-05 DIAGNOSIS — H52223 Regular astigmatism, bilateral: Secondary | ICD-10-CM | POA: Diagnosis not present

## 2019-11-05 DIAGNOSIS — H524 Presbyopia: Secondary | ICD-10-CM | POA: Diagnosis not present

## 2019-11-05 DIAGNOSIS — H16142 Punctate keratitis, left eye: Secondary | ICD-10-CM | POA: Diagnosis not present

## 2019-11-05 DIAGNOSIS — H5203 Hypermetropia, bilateral: Secondary | ICD-10-CM | POA: Diagnosis not present

## 2019-11-05 DIAGNOSIS — H02535 Eyelid retraction left lower eyelid: Secondary | ICD-10-CM | POA: Diagnosis not present

## 2019-12-03 DIAGNOSIS — Z23 Encounter for immunization: Secondary | ICD-10-CM | POA: Diagnosis not present

## 2019-12-24 DIAGNOSIS — Z23 Encounter for immunization: Secondary | ICD-10-CM | POA: Diagnosis not present

## 2020-03-30 DIAGNOSIS — E1149 Type 2 diabetes mellitus with other diabetic neurological complication: Secondary | ICD-10-CM | POA: Diagnosis not present

## 2020-03-30 DIAGNOSIS — E785 Hyperlipidemia, unspecified: Secondary | ICD-10-CM | POA: Diagnosis not present

## 2020-03-30 DIAGNOSIS — M810 Age-related osteoporosis without current pathological fracture: Secondary | ICD-10-CM | POA: Diagnosis not present

## 2020-04-01 DIAGNOSIS — E785 Hyperlipidemia, unspecified: Secondary | ICD-10-CM | POA: Diagnosis not present

## 2020-04-01 DIAGNOSIS — Z1212 Encounter for screening for malignant neoplasm of rectum: Secondary | ICD-10-CM | POA: Diagnosis not present

## 2020-04-05 DIAGNOSIS — E669 Obesity, unspecified: Secondary | ICD-10-CM | POA: Diagnosis not present

## 2020-04-05 DIAGNOSIS — Z0000 Encounter for general adult medical examination without abnormal findings: Secondary | ICD-10-CM | POA: Diagnosis not present

## 2020-04-05 DIAGNOSIS — E785 Hyperlipidemia, unspecified: Secondary | ICD-10-CM | POA: Diagnosis not present

## 2020-04-05 DIAGNOSIS — M1990 Unspecified osteoarthritis, unspecified site: Secondary | ICD-10-CM | POA: Diagnosis not present

## 2020-04-05 DIAGNOSIS — E1149 Type 2 diabetes mellitus with other diabetic neurological complication: Secondary | ICD-10-CM | POA: Diagnosis not present

## 2020-04-05 DIAGNOSIS — K219 Gastro-esophageal reflux disease without esophagitis: Secondary | ICD-10-CM | POA: Diagnosis not present

## 2020-04-05 DIAGNOSIS — I10 Essential (primary) hypertension: Secondary | ICD-10-CM | POA: Diagnosis not present

## 2020-04-05 DIAGNOSIS — G629 Polyneuropathy, unspecified: Secondary | ICD-10-CM | POA: Diagnosis not present

## 2020-06-29 DIAGNOSIS — Z23 Encounter for immunization: Secondary | ICD-10-CM | POA: Diagnosis not present

## 2020-07-12 DIAGNOSIS — H9313 Tinnitus, bilateral: Secondary | ICD-10-CM | POA: Diagnosis not present

## 2020-07-12 DIAGNOSIS — H903 Sensorineural hearing loss, bilateral: Secondary | ICD-10-CM | POA: Diagnosis not present

## 2020-08-18 DIAGNOSIS — I10 Essential (primary) hypertension: Secondary | ICD-10-CM | POA: Diagnosis not present

## 2020-08-18 DIAGNOSIS — G629 Polyneuropathy, unspecified: Secondary | ICD-10-CM | POA: Diagnosis not present

## 2020-08-18 DIAGNOSIS — E1149 Type 2 diabetes mellitus with other diabetic neurological complication: Secondary | ICD-10-CM | POA: Diagnosis not present

## 2020-08-18 DIAGNOSIS — M810 Age-related osteoporosis without current pathological fracture: Secondary | ICD-10-CM | POA: Diagnosis not present

## 2020-08-18 DIAGNOSIS — E663 Overweight: Secondary | ICD-10-CM | POA: Diagnosis not present

## 2020-10-20 ENCOUNTER — Encounter: Payer: Self-pay | Admitting: Gynecologic Oncology

## 2020-10-20 DIAGNOSIS — Z1231 Encounter for screening mammogram for malignant neoplasm of breast: Secondary | ICD-10-CM | POA: Diagnosis not present

## 2020-10-28 DIAGNOSIS — E119 Type 2 diabetes mellitus without complications: Secondary | ICD-10-CM | POA: Diagnosis not present

## 2020-10-28 DIAGNOSIS — H5203 Hypermetropia, bilateral: Secondary | ICD-10-CM | POA: Diagnosis not present

## 2020-10-28 DIAGNOSIS — H43813 Vitreous degeneration, bilateral: Secondary | ICD-10-CM | POA: Diagnosis not present

## 2020-10-28 DIAGNOSIS — I10 Essential (primary) hypertension: Secondary | ICD-10-CM | POA: Diagnosis not present

## 2020-10-28 DIAGNOSIS — H35033 Hypertensive retinopathy, bilateral: Secondary | ICD-10-CM | POA: Diagnosis not present

## 2020-10-28 DIAGNOSIS — H52223 Regular astigmatism, bilateral: Secondary | ICD-10-CM | POA: Diagnosis not present

## 2020-10-28 DIAGNOSIS — Z961 Presence of intraocular lens: Secondary | ICD-10-CM | POA: Diagnosis not present

## 2020-10-28 DIAGNOSIS — Z9849 Cataract extraction status, unspecified eye: Secondary | ICD-10-CM | POA: Diagnosis not present

## 2020-10-28 DIAGNOSIS — H524 Presbyopia: Secondary | ICD-10-CM | POA: Diagnosis not present

## 2020-11-22 ENCOUNTER — Telehealth: Payer: Self-pay | Admitting: *Deleted

## 2020-11-22 NOTE — Telephone Encounter (Signed)
Returned the patient's call and scheduled a follow up

## 2020-12-01 DIAGNOSIS — Z23 Encounter for immunization: Secondary | ICD-10-CM | POA: Diagnosis not present

## 2020-12-06 NOTE — Patient Instructions (Addendum)
You are doing well!  Your exam was normal today and there was nothing concerning on today's exam. Plan to follow up in two years or sooner if needed.  If you develop any new symptoms, please call the office and we will get you in to be evaluated. Please call for any questions, concerns, or new symptoms such as bleeding, abdominal pain, change in appetite or bladder/ bowel habits.   Please call the office at [phone] in July or August 2024 to schedule an appointment for October 2024 or sooner if needed.  Symptoms to report to your health care team include vaginal bleeding, rectal bleeding, bloating, weight loss without effort, new and persistent pain, new and  persistent fatigue, new leg swelling, new masses (i.e., bumps in your neck or groin), new and persistent cough, new and persistent nausea and vomiting, change in bowel or bladder habits, and any other concerns.

## 2020-12-06 NOTE — Progress Notes (Signed)
Follow Up Note: Gyn-Onc  Lori Little 80 y.o. female  CC:  Chief Complaint  Patient presents with   Uterine Carcinosarcoma   HPI: Lori Little is a 80 year old female who initially presented in late December 2009 to Dr. Verlon Au office with vaginal spotting.  An ultrasound was performed along with a repeat sonohystogram at a return visit in January 2010.  She presented to GYN Oncology in February 2010 as a new patient.  She underwent a total abdominal hysterectomy, bilateral salpingo-oophorectomy, and pelvic/ para-aortic lymphadenectomy in February 2010.  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  There was deep myometrial invasion with the tumor invading the entire endometrial cavity and lower uterine segment.  Furthermore, 2 out of 12 pelvic lymph nodes were positive.  Post-operatively, she received 6 cycles of carboplatin and taxol, which was completed in July 2010.  She has remained clinically free of disease since.        Interval History: She presents today to the office for continued follow up. She states she has been doing well since her last visit. She is tolerating her diet with no unintentional weight loss or gain, no early satiety. She denies abdominal pain, vaginal bleeding, change in bowel or bladder habits. 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 She states if she shakes her hands out after performing an activity such as shampooing her hair, the neuropathy symptoms improve. She has intermittent neuropathy like symptoms in her left leg but states it is improving.   She has been traveling to [place] and mountains and has a beach trip planned this fall with her daughter. She continues to follow up with Dr. Reynaldo Minium, PCP. She states she has a check up in November and reports having a good A1C level. She recently saw her ophthalmologist with no changes noted at that visit. She also follows with Dr. Ubaldo Glassing  with dermatology. She states there is a soft tissue area on her vulva that is unchanged and has been monitored by Dr. Reynaldo Minium and her dermatologist. She also reports receiving a letter stating she no longer needs colonoscopies given her age. Her BP was elevated at today's visit, but she did not take her BP medication this am and states it is under control. She continues to try to walk 150 minutes every week on the treadmill. No concerns voiced.   Review of Systems: Constitutional: Feels well. No fever, chills, early satiety, unintentional weight loss or gain.  Cardiovascular: No chest pain, shortness of breath, or edema.  Pulmonary: No cough or wheeze.  Gastrointestinal: No nausea, vomiting, or diarrhea. No bright red blood per rectum or change in bowel movement.  Genitourinary: No frequency, urgency, or dysuria. No vaginal bleeding or discharge.  Musculoskeletal: No myalgia or joint pain. Neurologic: Positive for neuropathy-unchanged. No weakness, new numbness, or change in gait.  Psychology: No depression, anxiety, or insomnia.  Health Maintenance: Mammogram: 10/20/2020- no evidence of malignancy Pap Smear: Not indicated Colonoscopy: No longer indicated due to age Dermatologist: Dr. Ubaldo Glassing PCP: Dr. Reynaldo Minium  Current Meds:  Outpatient Encounter Medications as of 12/07/2020  Medication Sig   alendronate (FOSAMAX) 70 MG tablet Take 70 mg by mouth every 7 (seven) days. Takes on Fridays. Take with a full glass of water on an empty stomach.   bisacodyl (DULCOLAX) 5 MG EC tablet Take 5 mg by mouth daily as needed for constipation.   Cholecalciferol (VITAMIN D3) 50 MCG (2000  UT) TABS Take 50 mcg by mouth daily.   hydrochlorothiazide (HYDRODIURIL) 12.5 MG tablet Take 12.5 mg by mouth daily.   levothyroxine (SYNTHROID, LEVOTHROID) 100 MCG tablet Take 100 mcg by mouth daily before breakfast.    metFORMIN (GLUCOPHAGE) 500 MG tablet Take 1,000 mg by mouth 2 (two) times daily with a meal.   olmesartan  (BENICAR) 40 MG tablet Take 40 mg by mouth daily.   pyridOXINE (VITAMIN B-6) 100 MG tablet Take 100 mg by mouth daily.   Red Yeast Rice 600 MG TABS Take 1 tablet by mouth daily.   [DISCONTINUED] olmesartan-hydrochlorothiazide (BENICAR HCT) 40-12.5 MG per tablet Take 1 tablet by mouth daily.   Multiple Vitamins-Minerals (MULTIVITAMIN PO) Take 2 tablets by mouth daily. (Patient not taking: Reported on 12/07/2020)   [DISCONTINUED] aspirin EC 81 MG tablet Take 81 mg by mouth daily.   [DISCONTINUED] Calcium Carbonate-Vit D-Min (CALCIUM 1200 PO) Take 1 tablet by mouth 2 (two) times daily.   [DISCONTINUED] Coenzyme Q10 200 MG capsule Take 200 mg by mouth daily.   [DISCONTINUED] fish oil-omega-3 fatty acids 1000 MG capsule Take 1 g by mouth daily.   [DISCONTINUED] Glucosamine-Chondroitin (COSAMIN DS PO) Take 1 tablet by mouth daily. 1500 Glucosamine & 1200 Chondroitin    [DISCONTINUED] GRAPE SEED EXTRACT PO Take 2 tablets by mouth daily.   [DISCONTINUED] sulfamethoxazole-trimethoprim (BACTRIM DS,SEPTRA DS) 800-160 MG tablet Take 1 tablet by mouth 2 (two) times daily.   [DISCONTINUED] vitamin B-12 (CYANOCOBALAMIN) 1000 MCG tablet Take 1,000 mcg by mouth daily.   No facility-administered encounter medications on file as of 12/07/2020.    Allergy:  Allergies  Allergen Reactions   Sennosides Hives    Ex- Lax    Social Hx:   Social History   Socioeconomic History   Marital status: Divorced    Spouse name: Not on file   Number of children: Not on file   Years of education: Not on file   Highest education level: Not on file  Occupational History   Not on file  Tobacco Use   Smoking status: Former    Years: 6.00    Types: Cigarettes    Quit date: 02/21/1976    Years since quitting: 44.8   Smokeless tobacco: Never  Substance and Sexual Activity   Alcohol use: No   Drug use: No   Sexual activity: Not on file  Other Topics Concern   Not on file  Social History Narrative   Not on file    Social Determinants of Health   Financial Resource Strain: Not on file  Food Insecurity: Not on file  Transportation Needs: Not on file  Physical Activity: Not on file  Stress: Not on file  Social Connections: Not on file  Intimate Partner Violence: Not on file    Past Surgical Hx:  Past Surgical History:  Procedure Laterality Date   ABDOMINAL HYSTERECTOMY     TAH with BSO, pelvic & periaortic lymphadenectomy   CATARACT EXTRACTION     GANGLION CYST EXCISION     Right wrist   TONSILLECTOMY     with adenoidectomy   UMBILICAL HERNIA REPAIR      Past Medical Hx:  Past Medical History:  Diagnosis Date   Carcinosarcoma of uterus ([place]) 04/22/2012   Diabetes mellitus    HTN (hypertension) 07/29/2012   Hypertension    Hypothyroid 07/29/2012   Hypothyroidism    Obesity    Pericardial effusion 07/30/2012   Plantar fasciitis    Tachycardia 07/30/2012   Uterine  cancer [facility])    uterine ca dx 2010    Family Hx:  Family History  Problem Relation Age of Onset   Lung cancer Mother    Diabetes Father    Healthy Sister    Healthy Sister    Healthy Sister     Vitals:  Blood pressure (!) 154/81, pulse 82, temperature 97.9 F (36.6 C), temperature source Oral, resp. rate 16, height 5' 3.5" (1.613 m), weight 167 lb (75.8 kg), SpO2 98 %.  Physical Exam: General: Well developed, well nourished female in no acute distress. Alert and oriented x 3.  Neck: Supple without any enlargements.  Lymph node survey: No cervical, supraclavicular, or inguinal adenopathy.  Cardiovascular: Regular rate and rhythm. S1 and S2 normal.  Lungs: Clear to auscultation bilaterally. No wheezes/crackles/rhonchi noted.  Skin: No rashes or lesions present. Back: No CVA tenderness.  Abdomen: Abdomen soft, non-tender. Active bowel sounds in all quadrants. No evidence of a fluid wave or abdominal masses.  Genitourinary:    Vulva/vagina: Normal external female genitalia. No lesions. Soft tissue growth on left  vulva soft, mobile-decreased in size per pt.    Urethra: No lesions or masses.    Vagina: Atrophic without any lesions. No palpable masses. No vaginal bleeding or drainage noted.  Rectal: Good tone, no masses, no cul de sac nodularity.  Extremities: No bilateral cyanosis, edema, or clubbing.    Assessment/Plan: 80 year old female with Stage IIIC1 carcinosarcoma of the uterus status post surgical resection in February 2010. She had adjuvant chemotherapy including carboplatin and taxol, which was completed in July of 2010.  She is clinically free of disease on today's exam with plans to follow up with GYN Oncology every other year (if doing well with no symptoms with continued visits with PCP) for continued follow up or sooner if needed.  Reportable signs and symptoms reviewed. She is advised to call for any questions or concerns or new symptoms.    Dorothyann Gibbs, NP 12/07/2020, 3:11 PM

## 2020-12-07 ENCOUNTER — Inpatient Hospital Stay: Payer: Medicare Other | Attending: Gynecologic Oncology | Admitting: Gynecologic Oncology

## 2020-12-07 ENCOUNTER — Other Ambulatory Visit: Payer: Self-pay

## 2020-12-07 VITALS — BP 154/81 | HR 82 | Temp 97.9°F | Resp 16 | Ht 63.5 in | Wt 167.0 lb

## 2020-12-07 DIAGNOSIS — Z90710 Acquired absence of both cervix and uterus: Secondary | ICD-10-CM | POA: Insufficient documentation

## 2020-12-07 DIAGNOSIS — Z90722 Acquired absence of ovaries, bilateral: Secondary | ICD-10-CM | POA: Insufficient documentation

## 2020-12-07 DIAGNOSIS — C55 Malignant neoplasm of uterus, part unspecified: Secondary | ICD-10-CM | POA: Insufficient documentation

## 2020-12-18 DIAGNOSIS — Z23 Encounter for immunization: Secondary | ICD-10-CM | POA: Diagnosis not present

## 2020-12-22 DIAGNOSIS — G629 Polyneuropathy, unspecified: Secondary | ICD-10-CM | POA: Diagnosis not present

## 2020-12-22 DIAGNOSIS — E1149 Type 2 diabetes mellitus with other diabetic neurological complication: Secondary | ICD-10-CM | POA: Diagnosis not present

## 2020-12-22 DIAGNOSIS — K219 Gastro-esophageal reflux disease without esophagitis: Secondary | ICD-10-CM | POA: Diagnosis not present

## 2020-12-22 DIAGNOSIS — I10 Essential (primary) hypertension: Secondary | ICD-10-CM | POA: Diagnosis not present

## 2020-12-22 DIAGNOSIS — E663 Overweight: Secondary | ICD-10-CM | POA: Diagnosis not present

## 2020-12-22 DIAGNOSIS — M810 Age-related osteoporosis without current pathological fracture: Secondary | ICD-10-CM | POA: Diagnosis not present

## 2021-01-11 DIAGNOSIS — D224 Melanocytic nevi of scalp and neck: Secondary | ICD-10-CM | POA: Diagnosis not present

## 2021-01-11 DIAGNOSIS — L821 Other seborrheic keratosis: Secondary | ICD-10-CM | POA: Diagnosis not present

## 2021-01-11 DIAGNOSIS — D225 Melanocytic nevi of trunk: Secondary | ICD-10-CM | POA: Diagnosis not present

## 2021-01-11 DIAGNOSIS — D2272 Melanocytic nevi of left lower limb, including hip: Secondary | ICD-10-CM | POA: Diagnosis not present

## 2021-01-11 DIAGNOSIS — L918 Other hypertrophic disorders of the skin: Secondary | ICD-10-CM | POA: Diagnosis not present

## 2021-01-11 DIAGNOSIS — D1801 Hemangioma of skin and subcutaneous tissue: Secondary | ICD-10-CM | POA: Diagnosis not present

## 2021-01-11 DIAGNOSIS — L812 Freckles: Secondary | ICD-10-CM | POA: Diagnosis not present

## 2021-01-11 DIAGNOSIS — L814 Other melanin hyperpigmentation: Secondary | ICD-10-CM | POA: Diagnosis not present

## 2021-01-11 DIAGNOSIS — D485 Neoplasm of uncertain behavior of skin: Secondary | ICD-10-CM | POA: Diagnosis not present

## 2021-10-26 ENCOUNTER — Encounter: Payer: Self-pay | Admitting: Gynecologic Oncology

## 2021-12-28 ENCOUNTER — Telehealth: Payer: Self-pay

## 2021-12-28 NOTE — Telephone Encounter (Deleted)
Message received from Kay (attachment sent to be scanned in chart):  Please see attached regarding patient Lori Little (DOB 06/30/1951).  Specimen [id number] did not yield sufficient DNA for our [facility] Heme test.   Are there any alternate biopsies you would like for [place] to pursue? If so, please submit a pathology report for the alternate specimen by email to [email or web address] or fax to [phone], and we will begin specimen procurement.   We offer our [facility] Liquid CDx liquid biopsy test if no other solid tissue specimens are available. If you would like kits for [facility] Liquid CDx or more information, please let me know. Thank you.

## 2021-12-28 NOTE — Telephone Encounter (Deleted)
Could you please review chart and recommend how we should proceed with [facility] testing.    I reached out to pathology, Dr. Reuel Derby says we do not have any additional blocks of tissue.

## 2022-01-16 NOTE — Telephone Encounter (Signed)
Erroneous entry

## 2022-11-03 ENCOUNTER — Encounter: Payer: Self-pay | Admitting: Gynecologic Oncology

## 2023-11-22 ENCOUNTER — Encounter: Payer: Self-pay | Admitting: Gynecologic Oncology
